# Patient Record
Sex: Female | Born: 1950
Health system: Southern US, Community
[De-identification: ages and names within clinical notes are randomized; demographics above are authoritative.]

## PROBLEM LIST (undated history)

## (undated) DIAGNOSIS — I340 Nonrheumatic mitral (valve) insufficiency: Secondary | ICD-10-CM

## (undated) DIAGNOSIS — M25512 Pain in left shoulder: Secondary | ICD-10-CM

## (undated) DIAGNOSIS — M79641 Pain in right hand: Secondary | ICD-10-CM

## (undated) DIAGNOSIS — M542 Cervicalgia: Secondary | ICD-10-CM

## (undated) DIAGNOSIS — M8580 Other specified disorders of bone density and structure, unspecified site: Secondary | ICD-10-CM

## (undated) HISTORY — DX: Cervicalgia: M54.2

## (undated) HISTORY — DX: Nonrheumatic mitral (valve) insufficiency: I34.0

## (undated) HISTORY — DX: Pain in right hand: M79.641

## (undated) HISTORY — DX: Other specified disorders of bone density and structure, unspecified site: M85.80

## (undated) HISTORY — DX: Pain in left shoulder: M25.512

---

## 1998-04-05 ENCOUNTER — Other Ambulatory Visit: Admission: RE | Admit: 1998-04-05 | Discharge: 1998-04-05 | Payer: Self-pay | Admitting: Gynecology

## 1999-04-30 ENCOUNTER — Other Ambulatory Visit: Admission: RE | Admit: 1999-04-30 | Discharge: 1999-04-30 | Payer: Self-pay | Admitting: Gynecology

## 2004-02-21 ENCOUNTER — Other Ambulatory Visit: Admission: RE | Admit: 2004-02-21 | Discharge: 2004-02-21 | Payer: Self-pay | Admitting: Gynecology

## 2005-04-16 ENCOUNTER — Ambulatory Visit (HOSPITAL_COMMUNITY): Admission: RE | Admit: 2005-04-16 | Discharge: 2005-04-16 | Payer: Self-pay | Admitting: Gastroenterology

## 2013-06-17 ENCOUNTER — Other Ambulatory Visit: Payer: Self-pay | Admitting: Gynecology

## 2013-06-17 DIAGNOSIS — R928 Other abnormal and inconclusive findings on diagnostic imaging of breast: Secondary | ICD-10-CM

## 2013-06-30 ENCOUNTER — Ambulatory Visit
Admission: RE | Admit: 2013-06-30 | Discharge: 2013-06-30 | Disposition: A | Discharge status: Home / Self Care | Payer: BC Managed Care – PPO | Source: Ambulatory Visit | Attending: Gynecology | Admitting: Gynecology

## 2013-06-30 DIAGNOSIS — R928 Other abnormal and inconclusive findings on diagnostic imaging of breast: Secondary | ICD-10-CM

## 2014-06-21 ENCOUNTER — Other Ambulatory Visit: Payer: Self-pay | Admitting: Gynecology

## 2014-06-21 DIAGNOSIS — R928 Other abnormal and inconclusive findings on diagnostic imaging of breast: Secondary | ICD-10-CM

## 2014-06-29 ENCOUNTER — Encounter (INDEPENDENT_AMBULATORY_CARE_PROVIDER_SITE_OTHER): Payer: Self-pay

## 2014-06-29 ENCOUNTER — Ambulatory Visit
Admission: RE | Admit: 2014-06-29 | Discharge: 2014-06-29 | Disposition: A | Discharge status: Home / Self Care | Payer: BC Managed Care – PPO | Source: Ambulatory Visit | Attending: Gynecology | Admitting: Gynecology

## 2014-06-29 DIAGNOSIS — R928 Other abnormal and inconclusive findings on diagnostic imaging of breast: Secondary | ICD-10-CM

## 2015-01-16 ENCOUNTER — Other Ambulatory Visit: Payer: Self-pay | Admitting: Obstetrics

## 2015-01-16 DIAGNOSIS — R921 Mammographic calcification found on diagnostic imaging of breast: Secondary | ICD-10-CM

## 2015-01-27 ENCOUNTER — Ambulatory Visit
Admission: RE | Admit: 2015-01-27 | Discharge: 2015-01-27 | Disposition: A | Discharge status: Home / Self Care | Payer: BLUE CROSS/BLUE SHIELD | Source: Ambulatory Visit | Attending: Obstetrics | Admitting: Obstetrics

## 2015-01-27 DIAGNOSIS — R921 Mammographic calcification found on diagnostic imaging of breast: Secondary | ICD-10-CM

## 2015-05-30 ENCOUNTER — Other Ambulatory Visit: Payer: Self-pay | Admitting: Obstetrics

## 2015-05-30 DIAGNOSIS — R921 Mammographic calcification found on diagnostic imaging of breast: Secondary | ICD-10-CM

## 2015-06-19 ENCOUNTER — Ambulatory Visit
Admission: RE | Admit: 2015-06-19 | Discharge: 2015-06-19 | Disposition: A | Discharge status: Home / Self Care | Payer: BLUE CROSS/BLUE SHIELD | Source: Ambulatory Visit | Attending: Obstetrics | Admitting: Obstetrics

## 2015-06-19 DIAGNOSIS — R921 Mammographic calcification found on diagnostic imaging of breast: Secondary | ICD-10-CM

## 2018-01-30 ENCOUNTER — Telehealth: Payer: Self-pay

## 2018-01-30 NOTE — Telephone Encounter (Signed)
SENT REFERRAL TO SCHEDULING 

## 2018-03-04 DIAGNOSIS — R002 Palpitations: Secondary | ICD-10-CM | POA: Insufficient documentation

## 2018-03-04 DIAGNOSIS — I340 Nonrheumatic mitral (valve) insufficiency: Secondary | ICD-10-CM | POA: Insufficient documentation

## 2018-03-04 NOTE — Progress Notes (Signed)
Cardiology Office Note    Date:  03/05/2018   ID:  Mikayla Mccoy, DOB 07/29/1951, MRN [id number]  PCP:  Mikayla RainierBarnes, Elizabeth, MD  Cardiologist: Mikayla NoeHenry W Smith III, MD   No chief complaint on file.   History of Present Illness:  Mikayla Mccoy is a 67 y.o. female referred by Dr. Juluis RainierElizabeth Mccoy because of palpitations and history of mitral regurgitation.  According to notes there is concerned about the appearance of her EKG.  Mikayla Mccoy is is doing relatively well.  She has not had syncope but has noted tachycardia at times when she awakens from sleep.  She does not feel she is awakening because of the tachycardia.  It can last anywhere from 30 seconds to several minutes.  No associated chest discomfort or dyspnea.  She occasionally feels short of breath when she lies down.  If she lies on her left side the shortness of breath becomes tolerable.  It is not always present.  She has no difficulty with lower extremity edema, chest pain, lightheadedness, dizziness, syncope, or exertional dyspnea.  Past Medical History:  Diagnosis Date  . Mitral regurgitation    mildly thickened MV by echo 7/11  . Neck pain   . Osteopenia   . Right hand pain   . Shoulder pain, left     No past surgical history on file.  Current Medications: Outpatient Medications Prior to Visit  Medication Sig Dispense Refill  . FIBER PO Take 1 tablet by mouth daily.    Marland Kitchen. ibuprofen (ADVIL,MOTRIN) 200 MG tablet Take 400 mg by mouth 3 (three) times daily as needed for headache or moderate pain.    . Multiple Vitamins-Minerals (MULTIVITAMIN PO) Take 1 tablet by mouth daily.    . Calcium Carbonate-Vitamin D (CALCIUM 500 + D PO) Chew 1 tablet by mouth daily.    . cholecalciferol (VITAMIN D) 1000 UNITS tablet Take 1,000 Units by mouth daily.    Marland Kitchen. estrogens, conjugated, (PREMARIN) 1.25 MG tablet Take 1.25 mg by mouth daily.    . fluticasone (FLONASE) 50 MCG/ACT nasal spray Place 1 spray into both nostrils daily.     Marland Kitchen.  KRILL OIL PO Take 1,000 mg by mouth daily.      No facility-administered medications prior to visit.      Allergies:   Codeine   Social History   Socioeconomic History  . Marital status: Married    Spouse name: None  . Number of children: None  . Years of education: None  . Highest education level: None  Social Needs  . Financial resource strain: None  . Food insecurity - worry: None  . Food insecurity - inability: None  . Transportation needs - medical: None  . Transportation needs - non-medical: None  Occupational History  . None  Tobacco Use  . Smoking status: Never Smoker  . Smokeless tobacco: Never Used  Substance and Sexual Activity  . Alcohol use: Yes    Comment: 1-2 per week  . Drug use: No  . Sexual activity: None  Other Topics Concern  . None  Social History Narrative   Tobacco use cigarettes: Never smoked   Tobacco history last updated 04/14/2014   No smoking   Alcohol: occasionally   Caffeine: yes    Occupation:part-time Sports coachcommunity service coordinator   Martial Status : married   Children 2 sons,1 daughter                 Family History:  The patient's family history includes Congenital  heart disease in her brother; Diabetes in her sister; Healthy in her brother, brother, daughter, son, and son; Hypertension in her brother, father, sister, and sister; Hypertrophic cardiomyopathy in her brother; Kidney disease in her sister; Lung disease in her father; Melanoma in her sister; Other in her mother; Parkinson's disease in her sister; Squamous cell carcinoma in her sister.  Mother had atrial fibrillation and a pacemaker.  Father died of a myocardial infarction.  Also has COPD.Marland Kitchen  Another brother had difficulty with an accessory pathway and had ablation because of arrhythmia.  Another brother has hypertrophic obstructive cardiomyopathy  ROS:   Please see the history of present illness.    Some anxiety concerning her strong family history of cardiac issues.   Also has had some concern because of elevated blood pressure recordings. All other systems reviewed and are negative.   PHYSICAL EXAM:   VS:  BP (!) 144/86   Pulse 68   Ht 5\' 6"  (1.676 m)   Wt 140 lb 12.8 oz (63.9 kg)   BMI 22.73 kg/m    GEN: Well nourished, well developed, in no acute distress  HEENT: normal  Neck: no JVD, carotid bruits, or masses Cardiac: RRR; no murmur of mitral regurgitation is noted. No rubs, or gallops,no edema  Respiratory:  clear to auscultation bilaterally, normal work of breathing GI: soft, nontender, nondistended, + BS MS: no deformity or atrophy  Skin: warm and dry, no rash Neuro:  Alert and Oriented x 3, Strength and sensation are intact Psych: euthymic mood, full affect  Wt Readings from Last 3 Encounters:  03/05/18 140 lb 12.8 oz (63.9 kg)      Studies/Labs Reviewed:   EKG:  EKG relatively low voltage, nonspecific T wave flattening, differing P wave morphology with atrial premature complexes noted.  Recent Labs: No results found for requested labs within last 8760 hours.   Lipid Panel No results found for: CHOL, TRIG, HDL, CHOLHDL, VLDL, LDLCALC, LDLDIRECT  Additional studies/ records that were reviewed today include:  Echocardiogram performed in 2011 by report from [facility] demonstrated mild mitral regurgitation, no mitral valve prolapse, and there was no mention of cardiomyopathy.    ASSESSMENT:    1. Palpitations   2. Non-rheumatic mitral regurgitation   3. SOB (shortness of breath)   4. Family history of vascular disease      PLAN:  In order of problems listed above:  1. EKGs of demonstrated PACs.  Nocturnal tachycardia of uncertain etiology.  Given family history of arrhythmia including accessory pathway in atrial fibrillation, we will do a screening 48-hour Holter monitor to exclude asymptomatic arrhythmias that could be associated with embolic CVA. 2. I do not believe she has significant mitral valve disease and do not feel a  repeat echo is indicated unless we find significant rhythm disturbance on screening monitor. 3. Shortness of breath could be GI related to reflux.  Rule out an atypical manifestation of coronary disease given her family history.  We will perform an exercise treadmill test to judge blood pressure response, rule out ischemia, and to assess exertional tolerance.  This will help [place] to exclude CAD.   If she does well on the above tests, no further evaluation will be required.    Medication Adjustments/Labs and Tests Ordered: Current medicines are reviewed at length with the patient today.  Concerns regarding medicines are outlined above.  Medication changes, Labs and Tests ordered today are listed in the Patient Instructions below. Patient Instructions  Medication Instructions:  Your physician  recommends that you continue on your current medications as directed. Please refer to the Current Medication list given to you today.  Labwork: None  Testing/Procedures: Your physician has requested that you have an exercise tolerance test. For further information please visit [email or web address] Please also follow instruction sheet, as given.  Your physician has recommended that you wear a 48 hour holter monitor. Holter monitors are medical devices that record the heart's electrical activity. Doctors most often use these monitors to diagnose arrhythmias. Arrhythmias are problems with the speed or rhythm of the heartbeat. The monitor is a small, portable device. You can wear one while you do your normal daily activities. This is usually used to diagnose what is causing palpitations/syncope (passing out).   Follow-Up: Your physician recommends that you schedule a follow-up appointment as needed with Dr. Katrinka Blazing.    Any Other Special Instructions Will Be Listed Below (If Applicable).     If you need a refill on your cardiac medications before your next appointment, please call your pharmacy.       Signed, Mikayla Noe, MD  03/05/2018 11:22 AM    [facility] [address] [place], [place], [place]  [address] Phone: [phone]; Fax: [phone]

## 2018-03-05 ENCOUNTER — Encounter: Payer: Self-pay | Admitting: Interventional Cardiology

## 2018-03-05 ENCOUNTER — Ambulatory Visit (INDEPENDENT_AMBULATORY_CARE_PROVIDER_SITE_OTHER): Payer: BLUE CROSS/BLUE SHIELD | Admitting: Interventional Cardiology

## 2018-03-05 VITALS — BP 144/86 | HR 68 | Ht 66.0 in | Wt 140.8 lb

## 2018-03-05 DIAGNOSIS — I340 Nonrheumatic mitral (valve) insufficiency: Secondary | ICD-10-CM

## 2018-03-05 DIAGNOSIS — Z8249 Family history of ischemic heart disease and other diseases of the circulatory system: Secondary | ICD-10-CM

## 2018-03-05 DIAGNOSIS — R002 Palpitations: Secondary | ICD-10-CM

## 2018-03-05 DIAGNOSIS — R0602 Shortness of breath: Secondary | ICD-10-CM

## 2018-03-05 NOTE — Patient Instructions (Signed)
Medication Instructions:  Your physician recommends that you continue on your current medications as directed. Please refer to the Current Medication list given to you today.  Labwork: None  Testing/Procedures: Your physician has requested that you have an exercise tolerance test. For further information please visit [email or web address] Please also follow instruction sheet, as given.  Your physician has recommended that you wear a 48 hour holter monitor. Holter monitors are medical devices that record the heart's electrical activity. Doctors most often use these monitors to diagnose arrhythmias. Arrhythmias are problems with the speed or rhythm of the heartbeat. The monitor is a small, portable device. You can wear one while you do your normal daily activities. This is usually used to diagnose what is causing palpitations/syncope (passing out).   Follow-Up: Your physician recommends that you schedule a follow-up appointment as needed with Dr. Katrinka BlazingSmith.    Any Other Special Instructions Will Be Listed Below (If Applicable).     If you need a refill on your cardiac medications before your next appointment, please call your pharmacy.

## 2018-03-18 ENCOUNTER — Ambulatory Visit (INDEPENDENT_AMBULATORY_CARE_PROVIDER_SITE_OTHER): Payer: BLUE CROSS/BLUE SHIELD

## 2018-03-18 DIAGNOSIS — R0602 Shortness of breath: Secondary | ICD-10-CM

## 2018-03-18 DIAGNOSIS — R002 Palpitations: Secondary | ICD-10-CM

## 2018-03-18 LAB — EXERCISE TOLERANCE TEST
Estimated workload: 10.1 METS
Exercise duration (min): 8 min
Exercise duration (sec): 0 s
MPHR: 155 {beats}/min
Peak HR: 153 {beats}/min
Percent HR: 98 %
RPE: 16
Rest HR: 98 {beats}/min

## 2018-04-09 ENCOUNTER — Telehealth: Payer: Self-pay | Admitting: Interventional Cardiology

## 2018-04-09 NOTE — Telephone Encounter (Signed)
Spoke with Mikayla Mccoy and made her aware that results have been sent to Dr. Katrinka BlazingSmith and once he reviews those I will give her a call with the results.  Mikayla Mccoy appreciative for call.

## 2018-04-09 NOTE — Telephone Encounter (Signed)
New Message  Pt calling to check on her holter monitor results. Please call

## 2018-04-13 ENCOUNTER — Telehealth: Payer: Self-pay | Admitting: *Deleted

## 2018-04-13 MED ORDER — METOPROLOL SUCCINATE ER 25 MG PO TB24: 25.0000 mg | ORAL_TABLET | Freq: Every day | ORAL | 3 refills | Status: DC

## 2018-04-13 NOTE — Telephone Encounter (Signed)
Spoke with pt and went over results and recommendations per Dr. Katrinka BlazingSmith. Pt verbalized understanding and was in agreement with this plan.  Scheduled pt to see Dr. Katrinka BlazingSmith on 5/28.  Pt appreciative for call.

## 2018-04-13 NOTE — Telephone Encounter (Signed)
-----   Message from Lyn RecordsHenry W Smith, MD sent at 04/11/2018  9:09 AM EDT ----- Let the patient know she is having SVT up to 16 beat runs and this is the likely explanation for fast heart rate at night. Recommend metoprolol succinate 25 mg daily and f/u to discuss in 4-6 weeks. Call if medication side effects, A copy will be sent to Juluis RainierBarnes, Elizabeth, MD

## 2018-05-25 NOTE — Progress Notes (Signed)
Cardiology Office Note    Date:  05/26/2018   ID:  Mikayla Mccoy, DOB 04/04/1951, MRN [id number]  PCP:  Juluis Rainier, MD  Cardiologist: Lesleigh Noe, MD   Chief Complaint  Patient presents with  . Coronary Artery Disease  . Irregular Heart Beat    History of Present Illness:  Mikayla Mccoy is a 67 y.o. female referred by Dr. Juluis Rainier because of palpitations and history of mitral regurgitation.  According to notes there is concerned about the appearance of her EKG.  Since the most recent office visit, she has had one episode of chest pressure with radiation of discomfort to the right jaw.  The episode occurred relatively spontaneously.  She has had no exertional chest discomfort.  There is no peripheral edema.  Medication side effects.  Recent monitor demonstrated brief PSVT.  Metoprolol was started.  No significant change in palpitations has been noted.   Past Medical History:  Diagnosis Date  . Mitral regurgitation    mildly thickened MV by echo 7/11  . Neck pain   . Osteopenia   . Right hand pain   . Shoulder pain, left     History reviewed. No pertinent surgical history.  Current Medications: Outpatient Medications Prior to Visit  Medication Sig Dispense Refill  . FIBER PO Take 1 tablet by mouth daily.    Marland Kitchen ibuprofen (ADVIL,MOTRIN) 200 MG tablet Take 400 mg by mouth 3 (three) times daily as needed for headache or moderate pain.    . metoprolol succinate (TOPROL-XL) 25 MG 24 hr tablet Take 1 tablet (25 mg total) by mouth daily. 90 tablet 3  . Multiple Vitamins-Minerals (MULTIVITAMIN PO) Take 1 tablet by mouth daily.     No facility-administered medications prior to visit.      Allergies:   Codeine   Social History   Socioeconomic History  . Marital status: Married    Spouse name: Not on file  . Number of children: Not on file  . Years of education: Not on file  . Highest education level: Not on file  Occupational History  . Not on file    Social Needs  . Financial resource strain: Not on file  . Food insecurity:    Worry: Not on file    Inability: Not on file  . Transportation needs:    Medical: Not on file    Non-medical: Not on file  Tobacco Use  . Smoking status: Never Smoker  . Smokeless tobacco: Never Used  Substance and Sexual Activity  . Alcohol use: Yes    Comment: 1-2 per week  . Drug use: No  . Sexual activity: Not on file  Lifestyle  . Physical activity:    Days per week: Not on file    Minutes per session: Not on file  . Stress: Not on file  Relationships  . Social connections:    Talks on phone: Not on file    Gets together: Not on file    Attends religious service: Not on file    Active member of club or organization: Not on file    Attends meetings of clubs or organizations: Not on file    Relationship status: Not on file  Other Topics Concern  . Not on file  Social History Narrative   Tobacco use cigarettes: Never smoked   Tobacco history last updated 04/14/2014   No smoking   Alcohol: occasionally   Caffeine: yes    Occupation:part-time Sports coach  Martial Status : married   Children 2 sons,1 daughter                 Family History:  The patient's family history includes Congenital heart disease in her brother; Diabetes in her sister; Healthy in her brother, brother, daughter, son, and son; Hypertension in her brother, father, sister, and sister; Hypertrophic cardiomyopathy in her brother; Kidney disease in her sister; Lung disease in her father; Melanoma in her sister; Other in her mother; Parkinson's disease in her sister; Squamous cell carcinoma in her sister.   ROS:   Please see the history of present illness.    She is not allergic to any medications.  She feels okay. All other systems reviewed and are negative.   PHYSICAL EXAM:   VS:  BP 104/60   Pulse 65   Ht  (1.702 m)   Wt 137 lb 12.8 oz (62.5 kg)   BMI 21.58 kg/m    GEN: Well nourished,  well developed, in no acute distress  HEENT: normal  Neck: no JVD, carotid bruits, or masses Cardiac: RRR; no murmurs, rubs, or gallops,no edema  Respiratory:  clear to auscultation bilaterally, normal work of breathing GI: soft, nontender, nondistended, + BS MS: no deformity or atrophy  Skin: warm and dry, no rash Neuro:  Alert and Oriented x 3, Strength and sensation are intact Psych: euthymic mood, full affect  Wt Readings from Last 3 Encounters:  05/26/18 137 lb 12.8 oz (62.5 kg)  03/05/18 140 lb 12.8 oz (63.9 kg)      Studies/Labs Reviewed:   EKG:  EKG  Not done  Recent Labs: No results found for requested labs within last 8760 hours.   Lipid Panel No results found for: CHOL, TRIG, HDL, CHOLHDL, VLDL, LDLCALC, LDLDIRECT  Additional studies/ records that were reviewed today include:   48 Hour Holter Monitor 03/18/18: Study Highlights      Basic underlying rhythm is normal sinus  PVCs and PACs are noted.  No sustained arrhythmias are noted.  Nonsustained SVT up to 157 bpm, longest episode 16 beats.  No symptoms recorded.   Basic underlying rhythm is normal sinus rhythm. Nonsustained SVT up to 16 beats. Isolated premature ventricular contractions.   Minimum HR: 48 BPM at 3:17:36 AM(2) Maximum HR: 152 BPM at 5:14:55 PM Average HR: 86 BPM    STRESS TEST 03/18/2018 Study Highlights      Blood pressure demonstrated a normal response to exercise.  There was no ST segment deviation noted during stress. Occasional PVC's  8 minutes exercise time. No CP.  Low risk exercise treadmill test with no electrocardiographic evidence of ischemia.       ASSESSMENT:    1. Angina pectoris (HCC)   2. Non-rheumatic mitral regurgitation   3. Palpitations      PLAN:  In order of problems listed above:  1. The chest pain episode is consistent with angina.  Given strong family history of CAD (father), hyperlipidemia, and age I feel further evaluation for ischemic  heart disease risk is necessary.  Plan to perform coronary CT with FFR if indicated.  Calcium score will help [place] also to determine if aggressive statin therapy is needed. 2. Clinically not significant 3. No change since starting beta-blocker therapy.  Aspirin once daily at 81 mg.  Will get lipid values from Dr. Zachery Dauer.  Depending upon findings from CT will decide about statin therapy.  CT coronary angios with morphology and FFR if indicated.  49-month follow-up  Extended office visit with greater than 50% of the 40-minute office visit spent rehashing risk factors for CAD, establishing treatment goals for risk modification, and coordinating further evaluation to exclude obstructive CAD.  Medication Adjustments/Labs and Tests Ordered: Current medicines are reviewed at length with the patient today.  Concerns regarding medicines are outlined above.  Medication changes, Labs and Tests ordered today are listed in the Patient Instructions below. There are no Patient Instructions on file for this visit.   Signed, Lesleigh Noe, MD  05/26/2018 10:40 AM    [facility] [address] [place], [place], [place]  [address] Phone: [phone]; Fax: [phone]

## 2018-05-26 ENCOUNTER — Encounter: Payer: Self-pay | Admitting: Interventional Cardiology

## 2018-05-26 ENCOUNTER — Ambulatory Visit (INDEPENDENT_AMBULATORY_CARE_PROVIDER_SITE_OTHER): Payer: BLUE CROSS/BLUE SHIELD | Admitting: Interventional Cardiology

## 2018-05-26 VITALS — BP 104/60 | HR 65 | Ht 67.0 in | Wt 137.8 lb

## 2018-05-26 DIAGNOSIS — I340 Nonrheumatic mitral (valve) insufficiency: Secondary | ICD-10-CM | POA: Diagnosis not present

## 2018-05-26 DIAGNOSIS — I209 Angina pectoris, unspecified: Secondary | ICD-10-CM | POA: Diagnosis not present

## 2018-05-26 DIAGNOSIS — R002 Palpitations: Secondary | ICD-10-CM | POA: Diagnosis not present

## 2018-05-26 MED ORDER — ASPIRIN EC 81 MG PO TBEC: 81.0000 mg | DELAYED_RELEASE_TABLET | Freq: Every day | ORAL | 3 refills | Status: AC

## 2018-05-26 NOTE — Patient Instructions (Addendum)
Medication Instructions:  1) START Aspirin  once daily  Labwork: Your physician recommends that you return for lab work when you are fasting (Lipid, CRP)   Testing/Procedures: Your physician has recommended that you have a Coronary CT with possible FFR.  Follow-Up: Your physician recommends that you schedule a follow-up appointment in:  2-3 months with Dr. Katrinka Blazing (Can have 7/31 at 11:40A)   Any Other Special Instructions Will Be Listed Below (If Applicable).  Please arrive at the [facility] main entrance of [facility] (30-45 minutes prior to test start time)  [facility] [address] [place], [place] [address] [phone]  Proceed to the [facility] Radiology Department (First Floor).  Please follow these instructions carefully (unless otherwise directed):  Hold all erectile dysfunction medications at least 48 hours prior to test.  On the Night Before the Test: . Drink plenty of water. . Do not consume any caffeinated/decaffeinated beverages or chocolate 12 hours prior to your test. . Do not take any antihistamines 12 hours prior to your test. . If you take Metformin do not take 24 hours prior to test.  On the Day of the Test: . Drink plenty of water. Do not drink any water within one hour of the test. . Do not eat any food 4 hours prior to the test. . You may take your regular medications prior to the test.  After the Test: . Drink plenty of water. . After receiving IV contrast, you may experience a mild flushed feeling. This is normal. . On occasion, you may experience a mild rash up to 24 hours after the test. This is not dangerous. If this occurs, you can take Benadryl 25 mg and increase your fluid intake. . If you experience trouble breathing, this can be serious. If it is severe call 911 IMMEDIATELY. If it is mild, please call our office. . If you take any of these medications: Glipizide/Metformin, Avandament, Glucavance, please do not  take 48 hours after completing test.    If you need a refill on your cardiac medications before your next appointment, please call your pharmacy.

## 2018-05-28 ENCOUNTER — Other Ambulatory Visit: Payer: BLUE CROSS/BLUE SHIELD | Admitting: *Deleted

## 2018-05-28 DIAGNOSIS — I209 Angina pectoris, unspecified: Secondary | ICD-10-CM

## 2018-05-28 LAB — LIPID PANEL
Chol/HDL Ratio: 3.3 ratio (ref 0.0–4.4)
Cholesterol, Total: 242 mg/dL — ABNORMAL HIGH (ref 100–199)
HDL: 74 mg/dL (ref >39)
LDL Calculated: 144 mg/dL — ABNORMAL HIGH (ref 0–99)
Triglycerides: 118 mg/dL (ref 0–149)
VLDL Cholesterol Cal: 24 mg/dL (ref 5–40)

## 2018-05-28 LAB — HIGH SENSITIVITY CRP: CRP, High Sensitivity: 0.96 mg/L (ref 0.00–3.00)

## 2018-07-06 ENCOUNTER — Other Ambulatory Visit: Payer: Self-pay | Admitting: *Deleted

## 2018-07-06 DIAGNOSIS — R002 Palpitations: Secondary | ICD-10-CM

## 2018-07-16 ENCOUNTER — Other Ambulatory Visit: Payer: BLUE CROSS/BLUE SHIELD | Admitting: *Deleted

## 2018-07-16 DIAGNOSIS — R002 Palpitations: Secondary | ICD-10-CM

## 2018-07-16 LAB — BASIC METABOLIC PANEL
BUN/Creatinine Ratio: 28 (ref 12–28)
BUN: 21 mg/dL (ref 8–27)
CO2: 22 mmol/L (ref 20–29)
Calcium: 9.6 mg/dL (ref 8.7–10.3)
Chloride: 102 mmol/L (ref 96–106)
Creatinine, Ser: 0.74 mg/dL (ref 0.57–1.00)
GFR calc Af Amer: 98 mL/min/{1.73_m2} (ref >59)
GFR calc non Af Amer: 85 mL/min/{1.73_m2} (ref >59)
Glucose: 92 mg/dL (ref 65–99)
Potassium: 4.4 mmol/L (ref 3.5–5.2)
Sodium: 140 mmol/L (ref 134–144)

## 2018-07-23 ENCOUNTER — Ambulatory Visit (HOSPITAL_COMMUNITY): Payer: BLUE CROSS/BLUE SHIELD

## 2018-07-23 ENCOUNTER — Ambulatory Visit (HOSPITAL_COMMUNITY)
Admission: RE | Admit: 2018-07-23 | Discharge: 2018-07-23 | Disposition: A | Discharge status: Home / Self Care | Payer: BLUE CROSS/BLUE SHIELD | Source: Ambulatory Visit | Attending: Interventional Cardiology | Admitting: Interventional Cardiology

## 2018-07-23 DIAGNOSIS — I209 Angina pectoris, unspecified: Secondary | ICD-10-CM

## 2018-07-23 MED ORDER — IOPAMIDOL (ISOVUE-370) INJECTION 76%
INTRAVENOUS | Status: AC
  Filled 2018-07-23: qty 100

## 2018-07-29 ENCOUNTER — Ambulatory Visit (INDEPENDENT_AMBULATORY_CARE_PROVIDER_SITE_OTHER): Payer: BLUE CROSS/BLUE SHIELD | Admitting: Interventional Cardiology

## 2018-07-29 ENCOUNTER — Encounter: Payer: Self-pay | Admitting: Interventional Cardiology

## 2018-07-29 ENCOUNTER — Ambulatory Visit (INDEPENDENT_AMBULATORY_CARE_PROVIDER_SITE_OTHER)
Admission: RE | Admit: 2018-07-29 | Discharge: 2018-07-29 | Disposition: A | Discharge status: Home / Self Care | Payer: BLUE CROSS/BLUE SHIELD | Source: Ambulatory Visit | Attending: Interventional Cardiology | Admitting: Interventional Cardiology

## 2018-07-29 VITALS — BP 102/68 | HR 76 | Ht 67.0 in | Wt 138.0 lb

## 2018-07-29 DIAGNOSIS — R0789 Other chest pain: Secondary | ICD-10-CM | POA: Diagnosis not present

## 2018-07-29 DIAGNOSIS — I471 Supraventricular tachycardia: Secondary | ICD-10-CM | POA: Diagnosis not present

## 2018-07-29 DIAGNOSIS — I340 Nonrheumatic mitral (valve) insufficiency: Secondary | ICD-10-CM

## 2018-07-29 NOTE — Patient Instructions (Signed)
Medication Instructions:  Your physician recommends that you continue on your current medications as directed. Please refer to the Current Medication list given to you today.  Labwork: None  Testing/Procedures: Your physician recommends that you have a Calcium Score.  Follow-Up: Your physician recommends that you schedule a follow-up appointment as needed with Dr. Katrinka BlazingSmith.    Any Other Special Instructions Will Be Listed Below (If Applicable).     If you need a refill on your cardiac medications before your next appointment, please call your pharmacy.

## 2018-07-29 NOTE — Progress Notes (Signed)
Cardiology Office Note:    Date:  07/29/2018   ID:  Mikayla Mccoy, DOB 11/29/1951, MRN [id number]  PCP:  Juluis RainierBarnes, Elizabeth, MD  Cardiologist:  Lesleigh NoeHenry W Smith III, MD   Referring MD: Juluis RainierBarnes, Elizabeth, MD   Chief Complaint  Patient presents with  . Irregular Heart Beat  . Chest Pain    History of Present Illness:    Mikayla Boysrlene Jilek is a 67 y.o. female with a hx of documented exercise-induced chest and jaw discomfort.  Exercise treadmill test did not reveal ST-T wave change consistent with angina..  Still having occasional palpitations.  Palpitations do not last longer than several seconds.  A recent monitor for 48 hours demonstrated nonsustained SVT lasting up to 16 beats at rates of 155 bpm.  No atrial fibrillation noted.  She still has occasional jaw and chest discomfort that occur at random.  We attempted to perform a coronary CT Angie oh but cardiac irregularity prevented gating and the study was therefore not performed.  She is resistant to the idea of medication therapy for hyperlipidemia.  A more detailed history concerning her family is that of one brother with cardiomyopathy, 2 siblings with heart arrhythmia, and mother with heart arrhythmia.   Past Medical History:  Diagnosis Date  . Mitral regurgitation    mildly thickened MV by echo 7/11  . Neck pain   . Osteopenia   . Right hand pain   . Shoulder pain, left     No past surgical history on file.  Current Medications: Current Meds  Medication Sig  . aspirin EC 81 MG tablet Take 1 tablet (81 mg total) by mouth daily.  . Cyanocobalamin (VITAMIN B-12 CR PO) Take by mouth daily.  Marland Kitchen. FIBER PO Take 1 tablet by mouth daily.  Marland Kitchen. ibuprofen (ADVIL,MOTRIN) 200 MG tablet Take 400 mg by mouth 3 (three) times daily as needed for headache or moderate pain.  . metoprolol succinate (TOPROL-XL) 25 MG 24 hr tablet Take 1 tablet (25 mg total) by mouth daily.  . Multiple Vitamins-Minerals (MULTIVITAMIN PO) Take 1 tablet by mouth  daily.  . Vitamin D, Ergocalciferol, 2000 units CAPS Take 2,000 Units by mouth daily.     Allergies:   Codeine   Social History   Socioeconomic History  . Marital status: Married    Spouse name: Not on file  . Number of children: Not on file  . Years of education: Not on file  . Highest education level: Not on file  Occupational History  . Not on file  Social Needs  . Financial resource strain: Not on file  . Food insecurity:    Worry: Not on file    Inability: Not on file  . Transportation needs:    Medical: Not on file    Non-medical: Not on file  Tobacco Use  . Smoking status: Never Smoker  . Smokeless tobacco: Never Used  Substance and Sexual Activity  . Alcohol use: Yes    Comment: 1-2 per week  . Drug use: No  . Sexual activity: Not on file  Lifestyle  . Physical activity:    Days per week: Not on file    Minutes per session: Not on file  . Stress: Not on file  Relationships  . Social connections:    Talks on phone: Not on file    Gets together: Not on file    Attends religious service: Not on file    Active member of club or organization: Not on file  Attends meetings of clubs or organizations: Not on file    Relationship status: Not on file  Other Topics Concern  . Not on file  Social History Narrative   Tobacco use cigarettes: Never smoked   Tobacco history last updated 04/14/2014   No smoking   Alcohol: occasionally   Caffeine: yes    Occupation:part-time Research officer, political party Status : married   Children 2 sons,1 daughter                 Family History: The patient's family history includes Congenital heart disease in her brother; Diabetes in her sister; Healthy in her brother, brother, daughter, son, and son; Hypertension in her brother, father, sister, and sister; Hypertrophic cardiomyopathy in her brother; Kidney disease in her sister; Lung disease in her father; Melanoma in her sister; Other in her mother; Parkinson's  disease in her sister; Squamous cell carcinoma in her sister.  ROS:   Please see the history of present illness.    Snoring, anxiety, irregular heartbeats, skipped heartbeats, leg pain, excessive fatigue, depression.  All other systems reviewed and are negative.  EKGs/Labs/Other Studies Reviewed:    The following studies were reviewed today: 48-hour Holter monitor 02/2018: Study Highlights     Basic underlying rhythm is normal sinus  PVCs and PACs are noted.  No sustained arrhythmias are noted.  Nonsustained SVT up to 157 bpm, longest episode 16 beats.  No symptoms recorded.   Basic underlying rhythm is normal sinus rhythm. Nonsustained SVT up to 16 beats. Isolated premature ventricular contractions.  Minimum HR: 48 BPM at 3:17:36 AM(2) Maximum HR: 152 BPM at 5:14:55 PM Average HR: 86 BPM   Exercise treadmill test 03/18/2018: Study Highlights     Blood pressure demonstrated a normal response to exercise.  There was no ST segment deviation noted during stress. Occasional PVC's  8 minutes exercise time. No CP.  Low risk exercise treadmill test with no electrocardiographic evidence of ischemia.     EKG:  EKG is not ordered today.    Recent Labs: 07/16/2018: BUN 21; Creatinine, Ser 0.74; Potassium 4.4; Sodium 140  Recent Lipid Panel    Component Value Date/Time   CHOL 242 (H) 05/28/2018 0923   TRIG 118 05/28/2018 0923   HDL 74 05/28/2018 0923   CHOLHDL 3.3 05/28/2018 0923   LDLCALC 144 (H) 05/28/2018 0923    Physical Exam:    VS:  BP 102/68   Pulse 76   Ht 5\' 7"  (1.702 m)   Wt 138 lb (62.6 kg)   BMI 21.61 kg/m     Wt Readings from Last 3 Encounters:  07/29/18 138 lb (62.6 kg)  05/26/18 137 lb 12.8 oz (62.5 kg)  03/05/18 140 lb 12.8 oz (63.9 kg)     GEN:  Well nourished, well developed in no acute distress HEENT: Normal NECK: No JVD. LYMPHATICS: No lymphadenopathy CARDIAC: RRR, 1/6 systolic murmur, no gallop, no edema. VASCULAR: Radial and  dorsalis pedis 2+ pulses.  No bruits. RESPIRATORY:  Clear to auscultation without rales, wheezing or rhonchi  ABDOMEN: Soft, non-tender, non-distended, No pulsatile mass, MUSCULOSKELETAL: No deformity  SKIN: Warm and dry NEUROLOGIC:  Alert and oriented x 3 PSYCHIATRIC:  Normal affect   ASSESSMENT:    1. Chest tightness   2. PSVT (paroxysmal supraventricular tachycardia) (HCC)   3. Non-rheumatic mitral regurgitation    PLAN:    In order of problems listed above:  1. We will perform coronary calcium score.  May need aggressive  lipid-lowering if plaque identified.  Continue aspirin daily. 2. Metoprolol XL 25 mg/day for nonsustained PSVT.  If she continues to have symptoms, may need a 30-day monitor. 3. Clinically insignificant at this time.  No significant murmur noted.  Clinical follow-up as needed.  Statin therapy if significant elevation and coronary calcium score.   Medication Adjustments/Labs and Tests Ordered: Current medicines are reviewed at length with the patient today.  Concerns regarding medicines are outlined above.  Orders Placed This Encounter  Procedures  . CT CARDIAC SCORING   No orders of the defined types were placed in this encounter.   Patient Instructions  Medication Instructions:  Your physician recommends that you continue on your current medications as directed. Please refer to the Current Medication list given to you today.  Labwork: None  Testing/Procedures: Your physician recommends that you have a Calcium Score.  Follow-Up: Your physician recommends that you schedule a follow-up appointment as needed with Dr. Katrinka Blazing.    Any Other Special Instructions Will Be Listed Below (If Applicable).     If you need a refill on your cardiac medications before your next appointment, please call your pharmacy.      Signed, Lesleigh Noe, MD  07/29/2018 12:32 PM    [facility]

## 2018-08-05 ENCOUNTER — Telehealth: Payer: Self-pay

## 2018-08-05 ENCOUNTER — Telehealth: Payer: Self-pay | Admitting: Interventional Cardiology

## 2018-08-05 DIAGNOSIS — E785 Hyperlipidemia, unspecified: Secondary | ICD-10-CM

## 2018-08-05 MED ORDER — ROSUVASTATIN CALCIUM 10 MG PO TABS: 10.0000 mg | ORAL_TABLET | Freq: Every day | ORAL | 11 refills | Status: DC

## 2018-08-05 NOTE — Telephone Encounter (Signed)
New Message:  Patient calling concerning results.

## 2018-08-05 NOTE — Telephone Encounter (Signed)
Returned call to Pt.   Advised of cardiac CT results.  Advised per Dr. Katrinka BlazingSmith start rosuvastatin 10 mg daily.  Recheck lipids/liver in 6 weeks.

## 2018-09-07 ENCOUNTER — Other Ambulatory Visit: Payer: BLUE CROSS/BLUE SHIELD | Admitting: *Deleted

## 2018-09-07 ENCOUNTER — Telehealth: Payer: Self-pay | Admitting: Interventional Cardiology

## 2018-09-07 DIAGNOSIS — E785 Hyperlipidemia, unspecified: Secondary | ICD-10-CM

## 2018-09-07 LAB — LIPID PANEL
Chol/HDL Ratio: 2.3 ratio (ref 0.0–4.4)
Cholesterol, Total: 181 mg/dL (ref 100–199)
HDL: 79 mg/dL (ref >39)
LDL Calculated: 85 mg/dL (ref 0–99)
Triglycerides: 84 mg/dL (ref 0–149)
VLDL Cholesterol Cal: 17 mg/dL (ref 5–40)

## 2018-09-07 LAB — HEPATIC FUNCTION PANEL
ALT: 22 IU/L (ref 0–32)
AST: 25 IU/L (ref 0–40)
Albumin: 4.6 g/dL (ref 3.6–4.8)
Alkaline Phosphatase: 89 IU/L (ref 39–117)
Bilirubin Total: 0.8 mg/dL (ref 0.0–1.2)
Bilirubin, Direct: 0.23 mg/dL (ref 0.00–0.40)
Total Protein: 6.5 g/dL (ref 6.0–8.5)

## 2018-09-07 NOTE — Telephone Encounter (Signed)
Will route to PharmD to see if any conflict with cardiac meds?

## 2018-09-07 NOTE — Telephone Encounter (Signed)
There are no issues with Imvexxy and her cardiac medications. This is an estradiol vaginal insert so may have similar side effect profile as systemic estradiol including VTE, HTN, edema, and headache. No noted family history of VTE. Would advise pt to monitor BP at home and for any swelling out of the ordinary.

## 2018-09-07 NOTE — Telephone Encounter (Signed)
New message    Pt came in for labs this morning and wanted to let Dr. Katrinka Blazing know she started taking Imvexxy.

## 2018-09-08 NOTE — Telephone Encounter (Signed)
Spoke with pt and made her aware of information provided by PharmD.  Pt verbalized understanding and was appreciative for call.

## 2019-04-07 ENCOUNTER — Other Ambulatory Visit: Payer: Self-pay | Admitting: Interventional Cardiology

## 2019-07-03 ENCOUNTER — Other Ambulatory Visit: Payer: Self-pay | Admitting: Interventional Cardiology

## 2019-07-05 NOTE — Telephone Encounter (Signed)
Ok to fill 

## 2019-10-15 ENCOUNTER — Other Ambulatory Visit: Payer: Self-pay | Admitting: Interventional Cardiology

## 2019-10-15 MED ORDER — ROSUVASTATIN CALCIUM 10 MG PO TABS: 10.0000 mg | ORAL_TABLET | Freq: Every day | ORAL | 0 refills | Status: DC

## 2019-10-15 NOTE — Telephone Encounter (Signed)
Pt's medication was sent to pt's pharmacy as requested. Confirmation received.  °

## 2019-11-05 NOTE — Progress Notes (Signed)
Cardiology Office Note   Date:  11/11/2019   ID:  Yona Kosek, DOB September 29, 1951, MRN [id number]  PCP:  Leighton Ruff, MD  Cardiologist: Dr. Tamala Julian, MD  Chief Complaint  Patient presents with   Follow-up    History of Present Illness: Mikayla Mccoy is a 68 y.o. female who presents for one year follow-up, seen for Dr. Tamala Julian.  Mikayla Mccoy has a history of exercise-induced chest pain in which she underwent an exercise treadmill stress test 03/18/2018 that was normal.  Also with a history of occasional palpitations in which she wore a 48-hour monitor that demonstrated nonsustained SVT lasting up to 16 beats with rates up to 155 bpm. There was no atrial fibrillation noted.  Seen by Dr. Tamala Julian on 07/29/2018 and continued to have occasional jaw and chest discomfort that would occur intermittently, nonexertional and exertional qualities.  Attempts to perform a coronary CTA however due to cardiac irregularity the study was not performed therefore coronary calcium scoring was performed 07/29/2018 which showed a coronary calcium score of 23, 66th percentile for age and sex matched control.  She was continued on Toprol XL 25 mg daily for nonsustained PSVT with recommendations to proceed with a 30-day monitor if symptoms proceed.  Statin therapy was discussed however patient was reluctant.  Most recent LDL on 09/07/2018 was 85 down from 144 on 05/28/2018.  Creatinine was stable at 0.74.   Today, Mikayla Mccoy is doing well from a cardiac perspective. Continues to have intermittent episodes of PSVT.  She wears an Audiological scientist with cardiac capabilities.  She will sometimes notice that her watch alarms for an elevated heart rate and will not to be having symptoms while other times she may feel mild palpitations.  Episodes seem to be fleeting.  Happening maybe once every few days.  Reports her episodes have improved after the initiation of metoprolol.  No longer having nocturnal episodes.  Discussed wearing a  30-day monitor for better assessment of rhythm.  For peace of mind, patient would like to pursue this.  She denies chest pain, shortness of breath, PND, LE swelling, orthopnea, dizziness, presyncopal or syncopal episodes.  Has been staying safe from Covid with masking and frequent handwashing.  Discussed performing lab work today given appointment cancellation by PCP secondary to Covid precautions.  Past Medical History:  Diagnosis Date   Mitral regurgitation    mildly thickened MV by echo 7/11   Neck pain    Osteopenia    Right hand pain    Shoulder pain, left     History reviewed. No pertinent surgical history.   Current Outpatient Medications  Medication Sig Dispense Refill   aspirin EC 81 MG tablet Take 1 tablet (81 mg total) by mouth daily. 90 tablet 3   conjugated estrogens (PREMARIN) vaginal cream Place 1 Applicatorful vaginally as needed.      FIBER PO Take 1 tablet by mouth daily.     ibuprofen (ADVIL,MOTRIN) 200 MG tablet Take 400 mg by mouth 3 (three) times daily as needed for headache or moderate pain.     metoprolol succinate (TOPROL-XL) 25 MG 24 hr tablet TAKE 1 TABLET BY MOUTH EVERY DAY 90 tablet 2   Multiple Vitamins-Minerals (MULTIVITAMIN PO) Take 1 tablet by mouth daily.     rosuvastatin (CRESTOR) 10 MG tablet Take 1 tablet (10 mg total) by mouth daily. 90 tablet 1   No current facility-administered medications for this visit.     Allergies:   Codeine    Social  History:  The patient  reports that she has never smoked. She has never used smokeless tobacco. She reports current alcohol use. She reports that she does not use drugs.   Family History:  The patient's family history includes Congenital heart disease in her brother; Diabetes in her sister; Healthy in her brother, brother, daughter, son, and son; Hypertension in her brother, father, sister, and sister; Hypertrophic cardiomyopathy in her brother; Kidney disease in her sister; Lung disease in her  father; Melanoma in her sister; Other in her mother; Parkinson's disease in her sister; Squamous cell carcinoma in her sister.    ROS:  Please see the history of present illness.   Otherwise, review of systems are positive for none.  All other systems are reviewed and negative.    PHYSICAL EXAM: VS:  BP 122/76    Pulse 72    Ht 5' 7"  (1.702 m)    Wt 140 lb (63.5 kg)    SpO2 99%    BMI 21.93 kg/m  , BMI Body mass index is 21.93 kg/m.    General: Well developed, well nourished, NAD Lungs:Clear to ausculation bilaterally. No wheezes, rales, or rhonchi. Breathing is unlabored. Cardiovascular: RRR with S1 S2. No murmurs Extremities: No edema. PT pulses 2+ bilaterally Neuro: Alert and oriented. No focal deficits. No facial asymmetry. MAE spontaneously. Psych: Responds to questions appropriately with normal affect.     EKG:  EKG is ordered today. The ekg ordered today demonstrates NSR, HR 62 bpm with no evidence of PVCs or PACs.  No acute changes.   Recent Labs: No results found for requested labs within last 8760 hours.    Lipid Panel    Component Value Date/Time   CHOL 181 09/07/2018 0902   TRIG 84 09/07/2018 0902   HDL 79 09/07/2018 0902   CHOLHDL 2.3 09/07/2018 0902   LDLCALC 85 09/07/2018 0902      Wt Readings from Last 3 Encounters:  11/11/19 140 lb (63.5 kg)  07/29/18 138 lb (62.6 kg)  05/26/18 137 lb 12.8 oz (62.5 kg)      Other studies Reviewed: Additional studies/ records that were reviewed today include:  48-hour Holter monitor 02/2018: Study Highlights     Basic underlying rhythm is normal sinus  PVCs and PACs are noted.  No sustained arrhythmias are noted.  Nonsustained SVT up to 157 bpm, longest episode 16 beats.  No symptoms recorded.  Basic underlying rhythm is normal sinus rhythm. Nonsustained SVT up to 16 beats. Isolated premature ventricular contractions.  Minimum HR: 48 BPM at 3:17:36 AM(2) Maximum HR: 152 BPM at 5:14:55 PM Average  HR: 86 BPM   Exercise treadmill test 03/18/2018: Study Highlights     Blood pressure demonstrated a normal response to exercise.  There was no ST segment deviation noted during stress. Occasional PVC's  8 minutes exercise time. No CP.  Low risk exercise treadmill test with no electrocardiographic evidence of ischemia.    ASSESSMENT AND PLAN:  1.  PSVT: -More controlled on Toprol-XL 25 mg p.o. daily -Recommendations per Dr. Tamala Julian for for 30-day monitor placement if symptoms persist>>> will place 30-day monitor for better assessment of symptoms, PSVT. -Reports symptoms have improved since initiation of Toprol, no longer having nocturnal symptoms -May need further titration if PSVT burden high  2.  History of chest tightness: -Recent coronary CTA attempted however given palpitations this was never performed therefore, coronary calcium score was performed on 07/29/2018 with a coronary calcium score of 23, 66th percentile for age  and sex matched control -No recurrent symptoms  3.  Mitral regurgitation: -Last echocardiogram dated 06/2010 with no recent study available -Clinically insignificant, no symptoms  Current medicines are reviewed at length with the patient today.  The patient does not have concerns regarding medicines.  The following changes have been made:  no change  Labs/ tests ordered today include: Lipid, CMET, 30-day monitor   Orders Placed This Encounter  Procedures   Lipid Profile   Comp Met (CMET)   Cardiac event monitor   EKG 12-Lead    Disposition:   FU with Dr. Tamala Julian in 1 year or sooner if needed  Signed, Kathyrn Drown, NP  11/11/2019 10:05 AM    [address], [place], [place]  [address] Phone: [phone]; Fax: [phone]

## 2019-11-10 ENCOUNTER — Other Ambulatory Visit: Payer: Self-pay | Admitting: Interventional Cardiology

## 2019-11-11 ENCOUNTER — Encounter: Payer: Self-pay | Admitting: Cardiology

## 2019-11-11 ENCOUNTER — Other Ambulatory Visit: Payer: Self-pay

## 2019-11-11 ENCOUNTER — Ambulatory Visit (INDEPENDENT_AMBULATORY_CARE_PROVIDER_SITE_OTHER): Payer: BC Managed Care – PPO | Admitting: Cardiology

## 2019-11-11 VITALS — BP 122/76 | HR 72 | Ht 67.0 in | Wt 140.0 lb

## 2019-11-11 DIAGNOSIS — R002 Palpitations: Secondary | ICD-10-CM

## 2019-11-11 DIAGNOSIS — I471 Supraventricular tachycardia: Secondary | ICD-10-CM | POA: Diagnosis not present

## 2019-11-11 DIAGNOSIS — E785 Hyperlipidemia, unspecified: Secondary | ICD-10-CM

## 2019-11-11 DIAGNOSIS — I340 Nonrheumatic mitral (valve) insufficiency: Secondary | ICD-10-CM | POA: Diagnosis not present

## 2019-11-11 LAB — COMPREHENSIVE METABOLIC PANEL
ALT: 19 IU/L (ref 0–32)
AST: 25 IU/L (ref 0–40)
Albumin/Globulin Ratio: 2.6 — ABNORMAL HIGH (ref 1.2–2.2)
Albumin: 4.4 g/dL (ref 3.8–4.8)
Alkaline Phosphatase: 85 IU/L (ref 39–117)
BUN/Creatinine Ratio: 20 (ref 12–28)
BUN: 16 mg/dL (ref 8–27)
Bilirubin Total: 0.5 mg/dL (ref 0.0–1.2)
CO2: 25 mmol/L (ref 20–29)
Calcium: 9.5 mg/dL (ref 8.7–10.3)
Chloride: 104 mmol/L (ref 96–106)
Creatinine, Ser: 0.81 mg/dL (ref 0.57–1.00)
GFR calc Af Amer: 87 mL/min/{1.73_m2} (ref >59)
GFR calc non Af Amer: 75 mL/min/{1.73_m2} (ref >59)
Globulin, Total: 1.7 g/dL (ref 1.5–4.5)
Glucose: 91 mg/dL (ref 65–99)
Potassium: 4.7 mmol/L (ref 3.5–5.2)
Sodium: 141 mmol/L (ref 134–144)
Total Protein: 6.1 g/dL (ref 6.0–8.5)

## 2019-11-11 LAB — LIPID PANEL
Chol/HDL Ratio: 2.4 ratio (ref 0.0–4.4)
Cholesterol, Total: 177 mg/dL (ref 100–199)
HDL: 75 mg/dL (ref >39)
LDL Chol Calc (NIH): 70 mg/dL (ref 0–99)
Triglycerides: 197 mg/dL — ABNORMAL HIGH (ref 0–149)
VLDL Cholesterol Cal: 32 mg/dL (ref 5–40)

## 2019-11-11 NOTE — Patient Instructions (Signed)
Medication Instructions:   Your physician recommends that you continue on your current medications as directed. Please refer to the Current Medication list given to you today.  *If you need a refill on your cardiac medications before your next appointment, please call your pharmacy*  Lab Work:  You will have labs drawn today: CMET and Non fasting lipid profile  If you have labs (blood work) drawn today and your tests are completely normal, you will receive your results only by: Marland Kitchen MyChart Message (if you have MyChart) OR . A paper copy in the mail If you have any lab test that is abnormal or we need to change your treatment, we will call you to review the results.  Testing/Procedures:  [employer] Cardiac Event Monitor Instructions  Your physician has requested you wear your cardiac event monitor for _30_ days, [employer] may call or text to confirm a shipping address. The monitor will be sent to a land address via UPS. [employer] will not ship a monitor to a PO BOX. It typically takes 3-5 days to receive your monitor after it has been enrolled. [employer] will assist with USPS tracking if your package is delayed. The telephone number for [employer] is [phone]. Once you have received your monitor, please review the enclosed instructions. Instruction tutorials can also be viewed under help and settings on the enclosed cell phone. Your monitor has already been registered assigning a specific monitor serial # to you.  Applying the monitor Remove cell phone from case and turn it on. The cell phone works as Dealer and needs to be within [employer] of you at all times. The cell phone will need to be charged on a daily basis. We recommend you plug the cell phone into the enclosed charger at your bedside table every night.  Monitor batteries: You will receive two monitor batteries labelled #1 and #2. These are your recorders. Plug battery #2 onto the second connection on the  enclosed charger. Keep one battery on the charger at all times. This will keep the monitor battery deactivated. It will also keep it fully charged for when you need to switch your monitor batteries. A small light will be blinking on the battery emblem when it is charging. The light on the battery emblem will remain on when the battery is fully charged.  Open package of a Monitor strip. Insert battery #1 into black hood on strip and gently squeeze monitor battery onto connection as indicated in instruction booklet. Set aside while preparing skin.  Choose location for your strip, vertical or horizontal, as indicated in the instruction booklet. Shave to remove all hair from location. There cannot be any lotions, oils, powders, or colognes on skin where monitor is to be applied. Wipe skin clean with enclosed Saline wipe. Dry skin completely.  Peel paper labeled #1 off the back of the Monitor strip exposing the adhesive. Place the monitor on the chest in the vertical or horizontal position shown in the instruction booklet. One arrow on the monitor strip must be pointing upward. Carefully remove paper labeled #2, attaching remainder of strip to your skin. Try not to create any folds or wrinkles in the strip as you apply it.  Firmly press and release the circle in the center of the monitor battery. You will hear a small beep. This is turning the monitor battery on. The heart emblem on the monitor battery will light up every 5 seconds if the monitor battery in turned on and connected to the patient  securely. Do not push and hold the circle down as this turns the monitor battery off. The cell phone will locate the monitor battery. A screen will appear on the cell phone checking the connection of your monitor strip. This may read poor connection initially but change to good connection within the next minute. Once your monitor accepts the connection you will hear a series of 3 beeps followed by a  climbing crescendo of beeps. A screen will appear on the cell phone showing the two monitor strip placement options. Touch the picture that demonstrates where you applied the monitor strip.  Your monitor strip and battery are waterproof. You are able to shower, bathe, or swim with the monitor on. They just ask you do not submerge deeper than 3 feet underwater. We recommend removing the monitor if you are swimming in a lake, river, or ocean.  Your monitor battery will need to be switched to a fully charged monitor battery approximately once a week. The cell phone will alert you of an action which needs to be made.  On the cell phone, tap for details to reveal connection status, monitor battery status, and cell phone battery status. The green dots indicates your monitor is in good status. A red dot indicates there is something that needs your attention.  To record a symptom, click the circle on the monitor battery. In 30-60 seconds a list of symptoms will appear on the cell phone. Select your symptom and tap save. Your monitor will record a sustained or significant arrhythmia regardless of you clicking the button. Some patients do not feel the heart rhythm irregularities. [employer] will notify us of any serious or critical events.  Refer to instruction booklet for instructions on switching batteries, changing strips, the Do not disturb or Pause features, or any additional questions.  Call [employer] at [phone], to confirm your monitor is transmitting and record your baseline. They will answer any questions you may have regarding the monitor instructions at that time.  Returning the monitor to [employer] Place all equipment back into blue box. Peel off strip of paper to expose adhesive and close box securely. There is a prepaid UPS shipping label on this box. Drop in a UPS drop box, or at a UPS facility like [employer]. You may also contact [employer] to arrange UPS to pick up monitor  package at your home.   Follow-Up: At [facility], you and your health needs are our priority.  As part of our continuing mission to provide you with exceptional heart care, we have created designated Provider Care Teams.  These Care Teams include your primary Cardiologist (physician) and Advanced Practice Providers (APPs -  Physician Assistants and Nurse Practitioners) who all work together to provide you with the care you need, when you need it.  Your next appointment:   12 months  The format for your next appointment:   In Person  Provider:   You may see Lesleigh Noe, MD or one of the following Advanced Practice Providers on your designated Care Team:    Norma Fredrickson, NP  Nada Boozer, NP  Georgie Chard, NP

## 2019-11-15 ENCOUNTER — Telehealth: Payer: Self-pay | Admitting: *Deleted

## 2019-11-15 NOTE — Telephone Encounter (Signed)
[employer] to ship a 30 day cardiac event monitor to her home.  Instructions included in the monitor kit. 

## 2019-11-22 ENCOUNTER — Ambulatory Visit (INDEPENDENT_AMBULATORY_CARE_PROVIDER_SITE_OTHER): Payer: BC Managed Care – PPO

## 2019-11-22 DIAGNOSIS — R002 Palpitations: Secondary | ICD-10-CM

## 2019-11-22 DIAGNOSIS — E785 Hyperlipidemia, unspecified: Secondary | ICD-10-CM

## 2019-11-22 DIAGNOSIS — I471 Supraventricular tachycardia: Secondary | ICD-10-CM

## 2020-01-07 ENCOUNTER — Other Ambulatory Visit: Payer: Self-pay | Admitting: Interventional Cardiology

## 2020-04-13 ENCOUNTER — Other Ambulatory Visit: Payer: Self-pay | Admitting: Interventional Cardiology

## 2020-10-31 ENCOUNTER — Other Ambulatory Visit: Payer: Self-pay | Admitting: Interventional Cardiology

## 2020-12-11 ENCOUNTER — Ambulatory Visit: Payer: BC Managed Care – PPO | Admitting: Interventional Cardiology

## 2020-12-30 NOTE — Progress Notes (Deleted)
Cardiology Office Note:    Date:  12/30/2020   ID:  Mikayla Mccoy, DOB 1951-10-02, MRN [id number]  PCP:  Juluis Rainier, MD  Cardiologist:  Lesleigh Noe, MD   Referring MD: Juluis Rainier, MD   No chief complaint on file.   History of Present Illness:    Mikayla Mccoy is a 70 y.o. female with a hx of non sustained PSVT, elevated low coronary calcium score 23,   Past Medical History:  Diagnosis Date  . Mitral regurgitation    mildly thickened MV by echo 7/11  . Neck pain   . Osteopenia   . Right hand pain   . Shoulder pain, left     No past surgical history on file.  Current Medications: No outpatient medications have been marked as taking for the 01/01/21 encounter (Appointment) with Lyn Records, MD.     Allergies:   Codeine   Social History   Socioeconomic History  . Marital status: Married    Spouse name: Not on file  . Number of children: Not on file  . Years of education: Not on file  . Highest education level: Not on file  Occupational History  . Not on file  Tobacco Use  . Smoking status: Never Smoker  . Smokeless tobacco: Never Used  Vaping Use  . Vaping Use: Never used  Substance and Sexual Activity  . Alcohol use: Yes    Comment: 1-2 per week  . Drug use: No  . Sexual activity: Not on file  Other Topics Concern  . Not on file  Social History Narrative   Tobacco use cigarettes: Never smoked   Tobacco history last updated 04/14/2014   No smoking   Alcohol: occasionally   Caffeine: yes    Occupation:part-time Research officer, political party Status : married   Children 2 sons,1 daughter               Social Determinants of Health   Financial Resource Strain: Not on file  Food Insecurity: Not on file  Transportation Needs: Not on file  Physical Activity: Not on file  Stress: Not on file  Social Connections: Not on file     Family History: The patient's family history includes Congenital heart disease in her  brother; Diabetes in her sister; Healthy in her brother, brother, daughter, son, and son; Hypertension in her brother, father, sister, and sister; Hypertrophic cardiomyopathy in her brother; Kidney disease in her sister; Lung disease in her father; Melanoma in her sister; Other in her mother; Parkinson's disease in her sister; Squamous cell carcinoma in her sister.  ROS:   Please see the history of present illness.    *** All other systems reviewed and are negative.  EKGs/Labs/Other Studies Reviewed:    The following studies were reviewed today:  CARDIAC MONITOR 10/2019: Study Highlights   NSR and sinus bradycardia  Baseline artefact made interpretation difficult  PAC's  Wandering atrial pacemaker rhythm at times vs multiple PAC's  No atrial fibrillation   COR CA Score 06/2018: IMPRESSION: Coronary calcium score of 23. This was 38 th percentile for age and sex matched control.  EXERCISE TREADMILL TEST 02/2018: Study Highlights    Blood pressure demonstrated a normal response to exercise.  There was no ST segment deviation noted during stress. Occasional PVC's  8 minutes exercise time. No CP.  Low risk exercise treadmill test with no electrocardiographic evidence of ischemia.     EKG:  EKG ***  Recent Labs:  No results found for requested labs within last 8760 hours.  Recent Lipid Panel    Component Value Date/Time   CHOL 177 11/11/2019 1013   TRIG 197 (H) 11/11/2019 1013   HDL 75 11/11/2019 1013   CHOLHDL 2.4 11/11/2019 1013   LDLCALC 70 11/11/2019 1013    Physical Exam:    VS:  There were no vitals taken for this visit.    Wt Readings from Last 3 Encounters:  11/11/19 140 lb (63.5 kg)  07/29/18 138 lb (62.6 kg)  05/26/18 137 lb 12.8 oz (62.5 kg)     GEN: ***. No acute distress HEENT: Normal NECK: No JVD. LYMPHATICS: No lymphadenopathy CARDIAC: *** murmur. RRR *** gallop, or edema. VASCULAR: *** Normal Pulses. No bruits. RESPIRATORY:  Clear to  auscultation without rales, wheezing or rhonchi  ABDOMEN: Soft, non-tender, non-distended, No pulsatile mass, MUSCULOSKELETAL: No deformity  SKIN: Warm and dry NEUROLOGIC:  Alert and oriented x 3 PSYCHIATRIC:  Normal affect   ASSESSMENT:    1. Chest tightness   2. PSVT (paroxysmal supraventricular tachycardia) (HCC)   3. Hyperlipidemia, unspecified hyperlipidemia type   4. Nonrheumatic mitral valve regurgitation   5. Educated about COVID-19 virus infection    PLAN:    In order of problems listed above:  1. ***   Medication Adjustments/Labs and Tests Ordered: Current medicines are reviewed at length with the patient today.  Concerns regarding medicines are outlined above.  No orders of the defined types were placed in this encounter.  No orders of the defined types were placed in this encounter.   There are no Patient Instructions on file for this visit.   Signed, Lesleigh Noe, MD  12/30/2020 2:31 PM    [facility]

## 2021-01-01 ENCOUNTER — Ambulatory Visit: Payer: BC Managed Care – PPO | Admitting: Interventional Cardiology

## 2021-01-01 DIAGNOSIS — E785 Hyperlipidemia, unspecified: Secondary | ICD-10-CM

## 2021-01-01 DIAGNOSIS — I340 Nonrheumatic mitral (valve) insufficiency: Secondary | ICD-10-CM

## 2021-01-01 DIAGNOSIS — R0789 Other chest pain: Secondary | ICD-10-CM

## 2021-01-01 DIAGNOSIS — Z7189 Other specified counseling: Secondary | ICD-10-CM

## 2021-01-01 DIAGNOSIS — I471 Supraventricular tachycardia: Secondary | ICD-10-CM

## 2021-01-22 ENCOUNTER — Other Ambulatory Visit: Payer: Self-pay | Admitting: Interventional Cardiology

## 2021-01-30 ENCOUNTER — Other Ambulatory Visit: Payer: Self-pay | Admitting: Interventional Cardiology

## 2021-04-10 NOTE — Progress Notes (Signed)
Cardiology Office Note:    Date:  04/11/2021   ID:  Mikayla Mccoy, DOB Aug 24, 1951, MRN [id number]  PCP:  Juluis Rainier, MD  Cardiologist:  Lesleigh Noe, MD   Referring MD: Juluis Rainier, MD   Chief Complaint  Patient presents with  . Coronary Artery Disease  . Hypertension  . Hyperlipidemia    History of Present Illness:    Mikayla Mccoy is a 70 y.o. female with a hx of chest pain, prediabetes, new diagnosis of obstructive sleep apnea, coronary calcium score 23 (2019), mitral regurgitation and history of PSVT.  Asymptomatic coronary disease without any symptoms of angina pectoris.  Treating risk factors of relevance.  Found out this year that she has an elevated hemoglobin A1c.  Medical therapy is being used at this time.  Rare palpitations.  Past Medical History:  Diagnosis Date  . Mitral regurgitation    mildly thickened MV by echo 7/11  . Neck pain   . Osteopenia   . Right hand pain   . Shoulder pain, left     History reviewed. No pertinent surgical history.  Current Medications: Current Meds  Medication Sig  . aspirin EC 81 MG tablet Take 1 tablet (81 mg total) by mouth daily.  Marland Kitchen FIBER PO Take 1 tablet by mouth daily.  Marland Kitchen ibuprofen (ADVIL,MOTRIN) 200 MG tablet Take 400 mg by mouth 3 (three) times daily as needed for headache or moderate pain.  . metoprolol succinate (TOPROL-XL) 25 MG 24 hr tablet Take 1 tablet (25 mg total) by mouth daily. Please keep upcoming appt in April 2022 with Dr. Katrinka Blazing before anymore refills. Thank you  . Multiple Vitamins-Minerals (MULTIVITAMIN PO) Take 1 tablet by mouth daily.  . rosuvastatin (CRESTOR) 10 MG tablet TAKE 1 TABLET (10 MG TOTAL) BY MOUTH DAILY. PLEASE KEEP UPCOMING APPT IN DECEMBER WITH DR. Katrinka Blazing FOR FUTURE REFILLS. THANK YOU     Allergies:   Codeine   Social History   Socioeconomic History  . Marital status: Married    Spouse name: Not on file  . Number of children: Not on file  . Years of education:  Not on file  . Highest education level: Not on file  Occupational History  . Not on file  Tobacco Use  . Smoking status: Never Smoker  . Smokeless tobacco: Never Used  Vaping Use  . Vaping Use: Never used  Substance and Sexual Activity  . Alcohol use: Yes    Comment: 1-2 per week  . Drug use: No  . Sexual activity: Not on file  Other Topics Concern  . Not on file  Social History Narrative   Tobacco use cigarettes: Never smoked   Tobacco history last updated 04/14/2014   No smoking   Alcohol: occasionally   Caffeine: yes    Occupation:part-time Research officer, political party Status : married   Children 2 sons,1 daughter               Social Determinants of Health   Financial Resource Strain: Not on file  Food Insecurity: Not on file  Transportation Needs: Not on file  Physical Activity: Not on file  Stress: Not on file  Social Connections: Not on file     Family History: The patient's family history includes Congenital heart disease in her brother; Diabetes in her sister; Healthy in her brother, brother, daughter, son, and son; Hypertension in her brother, father, sister, and sister; Hypertrophic cardiomyopathy in her brother; Kidney disease in her sister; Lung  disease in her father; Melanoma in her sister; Other in her mother; Parkinson's disease in her sister; Squamous cell carcinoma in her sister.  ROS:   Please see the history of present illness.    Occasional ankle swelling.  Toenail fungus.  She denies angina.  Exercises several days a week.  Her EKG is unchanged from prior.  All other systems reviewed and are negative.  EKGs/Labs/Other Studies Reviewed:    The following studies were reviewed today:  72 Hour Monitor 2019: Study Highlights   NSR and sinus bradycardia  Baseline artefact made interpretation difficult  PAC's  Wandering atrial pacemaker rhythm at times vs multiple PAC's  No atrial fibrillation    EKG:  EKG normal sinus  rhythm with leftward axis and left atrial abnormality.  Low voltage.  When compared to prior tracings,  Recent Labs: No results found for requested labs within last 8760 hours.  Recent Lipid Panel    Component Value Date/Time   CHOL 177 11/11/2019 1013   TRIG 197 (H) 11/11/2019 1013   HDL 75 11/11/2019 1013   CHOLHDL 2.4 11/11/2019 1013   LDLCALC 70 11/11/2019 1013    Physical Exam:    VS:  BP 140/72   Pulse 68   Ht 5\' 7"  (1.702 m)   Wt 132 lb 12.8 oz (60.2 kg)   SpO2 97%   BMI 20.80 kg/m     Wt Readings from Last 3 Encounters:  04/11/21 132 lb 12.8 oz (60.2 kg)  11/11/19 140 lb (63.5 kg)  07/29/18 138 lb (62.6 kg)     GEN: Slender. No acute distress HEENT: Normal NECK: No JVD. LYMPHATICS: No lymphadenopathy CARDIAC: No murmur. RRR S4 gallop, or edema. VASCULAR:  Normal Pulses. No bruits. RESPIRATORY:  Clear to auscultation without rales, wheezing or rhonchi  ABDOMEN: Soft, non-tender, non-distended, No pulsatile mass, MUSCULOSKELETAL: No deformity  SKIN: Warm and dry NEUROLOGIC:  Alert and oriented x 3 PSYCHIATRIC:  Normal affect   ASSESSMENT:    1. PSVT (paroxysmal supraventricular tachycardia) (HCC)   2. Hyperlipidemia, unspecified hyperlipidemia type   3. Nonrheumatic mitral valve regurgitation   4. Prediabetes   5. Elevated blood pressure reading   6. Obstructive sleep apnea on CPAP    PLAN:    In order of problems listed above:  1. Controlled with low-dose beta-blocker therapy 2. Most recent LDL cholesterol was 68 on 10 mg of rosuvastatin daily.  She will pause therapy to treat nail fungus.  She will resume after that. 3. No auscultatory evidence of significant mitral valve disease. 4. Discussed limiting carbohydrates/alcohol as a means to control the A1c. 5. Decrease sodium in diet.  Decrease alcohol intake.  Target 130/80 mmHg. 6. Encourage CPAP use.   Clinical f/u in 1 year.  Overall education and awareness concerning primary/secondary risk  prevention was discussed in detail: LDL less than 70, hemoglobin A1c less than 7, blood pressure target less than 130/80 mmHg, >150 minutes of moderate aerobic activity per week, avoidance of smoking, weight control (via diet and exercise), and continued surveillance/management of/for obstructive sleep apnea.    Medication Adjustments/Labs and Tests Ordered: Current medicines are reviewed at length with the patient today.  Concerns regarding medicines are outlined above.  Orders Placed This Encounter  Procedures  . EKG 12-Lead   No orders of the defined types were placed in this encounter.   Patient Instructions  Medication Instructions:  Your physician recommends that you continue on your current medications as directed. Please refer to the Current  Medication list given to you today.  *If you need a refill on your cardiac medications before your next appointment, please call your pharmacy*   Lab Work: None If you have labs (blood work) drawn today and your tests are completely normal, you will receive your results only by: Marland Kitchen MyChart Message (if you have MyChart) OR . A paper copy in the mail If you have any lab test that is abnormal or we need to change your treatment, we will call you to review the results.   Testing/Procedures: None   Follow-Up: At [facility], you and your health needs are our priority.  As part of our continuing mission to provide you with exceptional heart care, we have created designated Provider Care Teams.  These Care Teams include your primary Cardiologist (physician) and Advanced Practice Providers (APPs -  Physician Assistants and Nurse Practitioners) who all work together to provide you with the care you need, when you need it.  We recommend signing up for the patient portal called "MyChart".  Sign up information is provided on this After Visit Summary.  MyChart is used to connect with patients for Virtual Visits (Telemedicine).  Patients are able  to view lab/test results, encounter notes, upcoming appointments, etc.  Non-urgent messages can be sent to your provider as well.   To learn more about what you can do with MyChart, go to [email or web address].    Your next appointment:   1 year(s)  The format for your next appointment:   In Person  Provider:   You may see Lesleigh Noe, MD or one of the following Advanced Practice Providers on your designated Care Team:    Georgie Chard, NP    Other Instructions      Signed, Lesleigh Noe, MD  04/11/2021 2:16 PM     [facility]

## 2021-04-11 ENCOUNTER — Ambulatory Visit (INDEPENDENT_AMBULATORY_CARE_PROVIDER_SITE_OTHER): Payer: Medicare Other | Admitting: Interventional Cardiology

## 2021-04-11 ENCOUNTER — Encounter: Payer: Self-pay | Admitting: Interventional Cardiology

## 2021-04-11 ENCOUNTER — Other Ambulatory Visit: Payer: Self-pay

## 2021-04-11 VITALS — BP 140/72 | HR 68 | Ht 67.0 in | Wt 132.8 lb

## 2021-04-11 DIAGNOSIS — G4733 Obstructive sleep apnea (adult) (pediatric): Secondary | ICD-10-CM

## 2021-04-11 DIAGNOSIS — R7303 Prediabetes: Secondary | ICD-10-CM | POA: Diagnosis not present

## 2021-04-11 DIAGNOSIS — Z9989 Dependence on other enabling machines and devices: Secondary | ICD-10-CM

## 2021-04-11 DIAGNOSIS — I340 Nonrheumatic mitral (valve) insufficiency: Secondary | ICD-10-CM | POA: Diagnosis not present

## 2021-04-11 DIAGNOSIS — E785 Hyperlipidemia, unspecified: Secondary | ICD-10-CM

## 2021-04-11 DIAGNOSIS — I471 Supraventricular tachycardia: Secondary | ICD-10-CM

## 2021-04-11 DIAGNOSIS — R030 Elevated blood-pressure reading, without diagnosis of hypertension: Secondary | ICD-10-CM

## 2021-04-11 NOTE — Patient Instructions (Signed)

## 2021-04-25 ENCOUNTER — Other Ambulatory Visit: Payer: Self-pay | Admitting: Interventional Cardiology

## 2021-04-26 ENCOUNTER — Other Ambulatory Visit: Payer: Self-pay | Admitting: Interventional Cardiology

## 2021-12-03 ENCOUNTER — Other Ambulatory Visit: Payer: Self-pay | Admitting: Family Medicine

## 2021-12-03 ENCOUNTER — Ambulatory Visit
Admission: RE | Admit: 2021-12-03 | Discharge: 2021-12-03 | Disposition: A | Discharge status: Home / Self Care | Payer: Medicare Other | Source: Ambulatory Visit | Attending: Family Medicine | Admitting: Family Medicine

## 2021-12-03 ENCOUNTER — Other Ambulatory Visit: Payer: Self-pay

## 2021-12-03 DIAGNOSIS — J069 Acute upper respiratory infection, unspecified: Secondary | ICD-10-CM

## 2021-12-03 DIAGNOSIS — R059 Cough, unspecified: Secondary | ICD-10-CM

## 2022-02-28 IMAGING — CR DG CHEST 2V
2 series · 2 of 2 positions shown · non-contrast
Comparison: CT heart calcium scoring 07/29/2018

CLINICAL DATA: Cough.  Upper respiratory infection.

EXAM:
CHEST - 2 VIEW

[w chest pa]
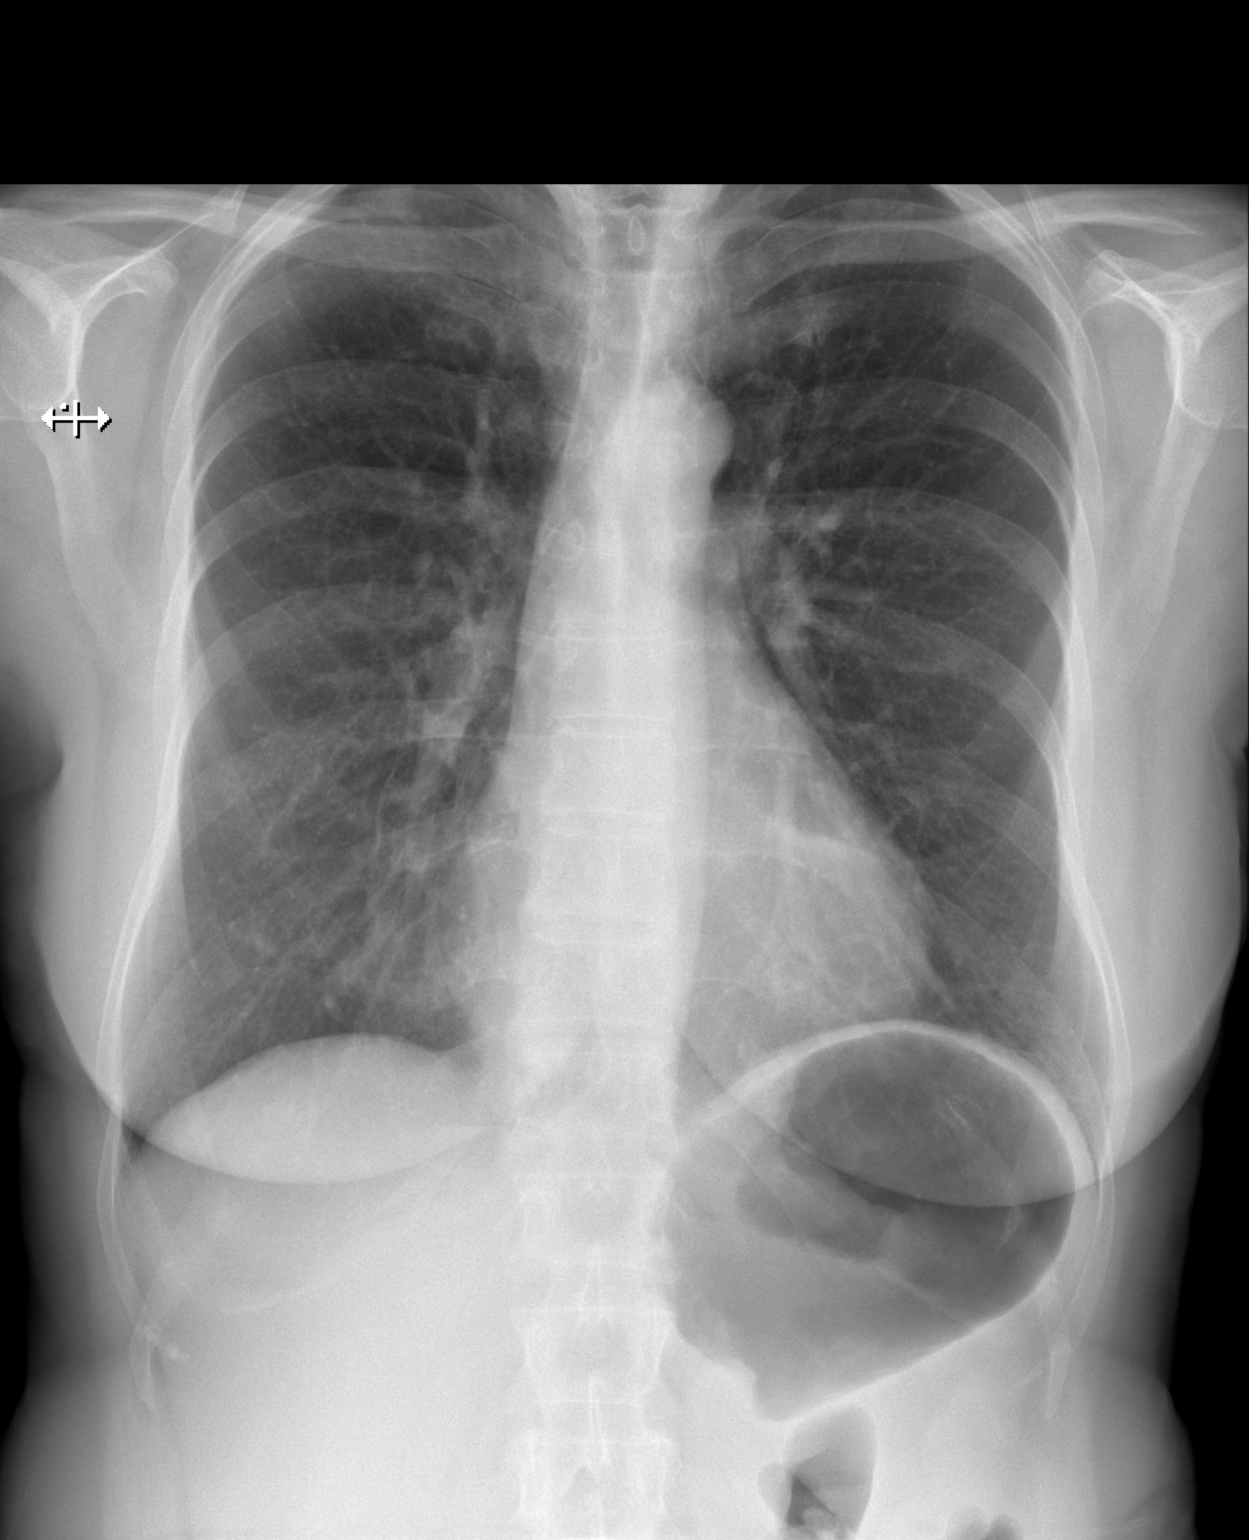

[w chest lat]
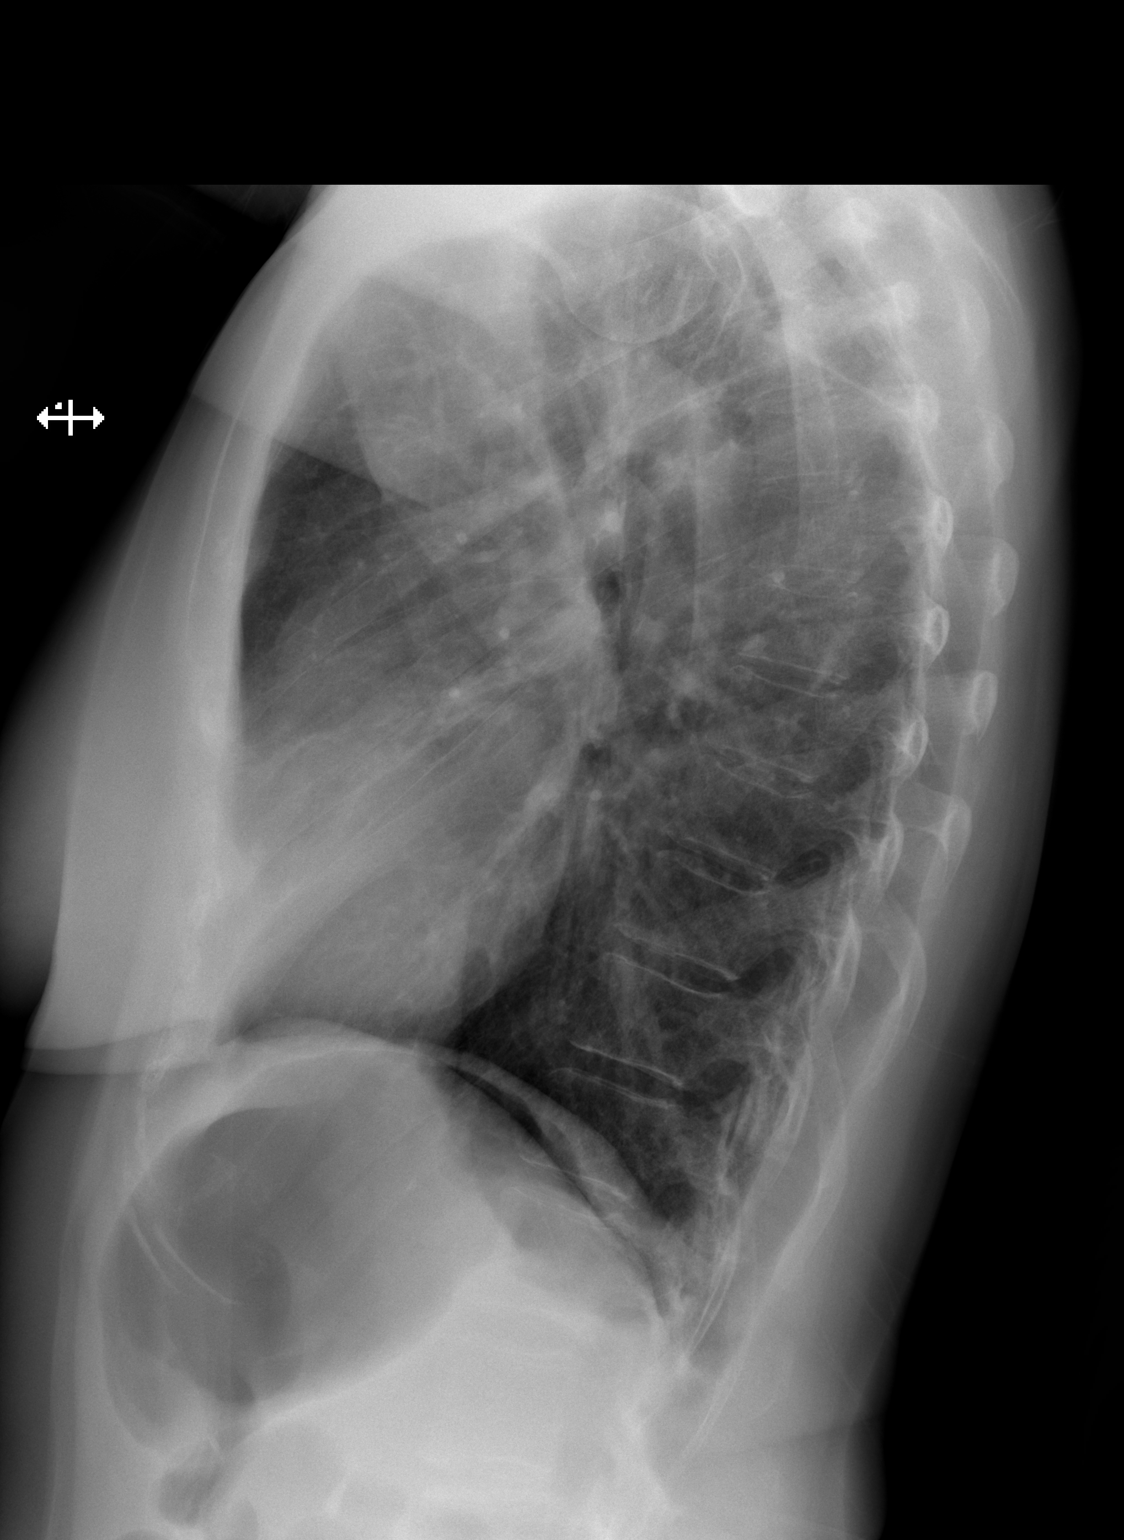

[2 of 2 positions shown; findings below may reference images not displayed]

FINDINGS: Heart is upper limits of normal for size. Lungs are clear. No edema
or effusion is present. No focal airspace disease is present. Axial
skeleton is within normal limits.
IMPRESSION: Borderline cardiomegaly without failure.  Lungs are clear.

## 2022-04-25 ENCOUNTER — Other Ambulatory Visit: Payer: Self-pay | Admitting: Interventional Cardiology

## 2022-04-27 NOTE — Progress Notes (Signed)
?Cardiology Office Note:   ? ?Date:  04/29/2022  ? ?IDRyelynn Mccoy, DOB 04-Dec-1951, MRN [id number] ? ?PCP:  Sigmund Hazel, MD  ?Cardiologist:  Lesleigh Noe, MD  ? ?Referring MD: Sigmund Hazel, MD  ? ?No chief complaint on file. ? ? ?History of Present Illness:   ? ?Mikayla Mccoy is a 71 y.o. female with a hx of chest pain, prediabetes, new diagnosis of obstructive sleep apnea, coronary calcium score 23 (2019), mitral regurgitation and history of PSVT. ? ? ?Mikayla Mccoy is doing well.  She did have COVID-19 since the last office visit.  Following that she has had a persisting cough and does not feel that she is back to her full capacity from the standpoint of aerobic tolerance. ? ?She denies quality of life issues.  She has rare palpitations.  No sustained arrhythmia.  She denies claudication.  They have a three-story house and she is up and down multiple flights most every day without limitations. ? ?Past Medical History:  ?Diagnosis Date  ? Mitral regurgitation   ? mildly thickened MV by echo 7/11  ? Neck pain   ? Osteopenia   ? Right hand pain   ? Shoulder pain, left   ? ? ?History reviewed. No pertinent surgical history. ? ?Current Medications: ?Current Meds  ?Medication Sig  ? aspirin EC 81 MG tablet Take 1 tablet (81 mg total) by mouth daily.  ? Fexofenadine HCl (ALLEGRA ALLERGY PO) Take 10 mg by mouth daily.  ? FIBER PO Take 1 tablet by mouth daily.  ? ibuprofen (ADVIL,MOTRIN) 200 MG tablet Take 400 mg by mouth 3 (three) times daily as needed for headache or moderate pain.  ? metoprolol succinate (TOPROL-XL) 25 MG 24 hr tablet Take 1 tablet (25 mg total) by mouth daily.  ? Multiple Vitamins-Minerals (MULTIVITAMIN PO) Take 1 tablet by mouth daily.  ? Probiotic Product (ALIGN PO) Take 1 capsule by mouth daily.  ? rosuvastatin (CRESTOR) 10 MG tablet Take 1 tablet (10 mg total) by mouth daily. Appointment needed for future refills  ?  ? ?Allergies:   Codeine  ? ?Social History  ? ?Socioeconomic History  ? Marital  status: Married  ?  Spouse name: Not on file  ? Number of children: Not on file  ? Years of education: Not on file  ? Highest education level: Not on file  ?Occupational History  ? Not on file  ?Tobacco Use  ? Smoking status: Never  ? Smokeless tobacco: Never  ?Vaping Use  ? Vaping Use: Never used  ?Substance and Sexual Activity  ? Alcohol use: Yes  ?  Comment: 1-2 per week  ? Drug use: No  ? Sexual activity: Not on file  ?Other Topics Concern  ? Not on file  ?Social History Narrative  ? Tobacco use cigarettes: Never smoked  ? Tobacco history last updated 04/14/2014  ? No smoking  ? Alcohol: occasionally  ? Caffeine: yes   ? Occupation:part-time Sports coach  ? Martial Status : married  ? Children 2 sons,1 daughter  ?   ?   ?   ?   ? ?Social Determinants of Health  ? ?Financial Resource Strain: Not on file  ?Food Insecurity: Not on file  ?Transportation Needs: Not on file  ?Physical Activity: Not on file  ?Stress: Not on file  ?Social Connections: Not on file  ?  ? ?Family History: ?The patient's family history includes Congenital heart disease in her brother; Diabetes in her sister;  Healthy in her brother, brother, daughter, son, and son; Hypertension in her brother, father, sister, and sister; Hypertrophic cardiomyopathy in her brother; Kidney disease in her sister; Lung disease in her father; Melanoma in her sister; Other in her mother; Parkinson's disease in her sister; Squamous cell carcinoma in her sister. ? ?ROS:   ?Please see the history of present illness.    ?Denies chest pain, syncope, medication side effects, and syncope.  All other systems reviewed and are negative. ? ?EKGs/Labs/Other Studies Reviewed:   ? ?The following studies were reviewed today: ? ?Coronary calcium score 07/29/2018: ?IMPRESSION: ?Coronary calcium score of 23. This was 2566 th percentile for age and ?sex matched control. ? ?EKG:  EKG sinus bradycardia with incomplete right bundle.  Compared to April 2022 heart rate and  morphology are unchanged. ? ?Recent Labs: ?No results found for requested labs within last 8760 hours.  ?Recent Lipid Panel ?   ?Component Value Date/Time  ? CHOL 177 11/11/2019 1013  ? TRIG 197 (H) 11/11/2019 1013  ? HDL 75 11/11/2019 1013  ? CHOLHDL 2.4 11/11/2019 1013  ? LDLCALC 70 11/11/2019 1013  ? ? ?Physical Exam:   ? ?VS:  BP 128/70   Pulse (!) 55   Ht 5\' 7"  (1.702 m)   Wt 133 lb (60.3 kg)   SpO2 99%   BMI 20.83 kg/m?    ? ?Wt Readings from Last 3 Encounters:  ?04/29/22 133 lb (60.3 kg)  ?04/11/21 132 lb 12.8 oz (60.2 kg)  ?11/11/19 140 lb (63.5 kg)  ?  ? ?GEN: Healthy appearing. No acute distress ?HEENT: Normal ?NECK: No JVD. ?LYMPHATICS: No lymphadenopathy ?CARDIAC: No murmur. RRR S4 gallop, or edema. ?VASCULAR:  Normal Pulses. No bruits. ?RESPIRATORY:  Clear to auscultation without rales, wheezing or rhonchi  ?ABDOMEN: Soft, non-tender, non-distended, No pulsatile mass, ?MUSCULOSKELETAL: No deformity  ?SKIN: Warm and dry ?NEUROLOGIC:  Alert and oriented x 3 ?PSYCHIATRIC:  Normal affect  ? ?ASSESSMENT:   ? ?1. PSVT (paroxysmal supraventricular tachycardia) (HCC)   ?2. Hyperlipidemia, unspecified hyperlipidemia type   ?3. Nonrheumatic mitral valve regurgitation   ?4. Prediabetes   ?5. Agatston coronary artery calcium score less than 100   ?6. Obstructive sleep apnea on CPAP   ? ?PLAN:   ? ?In order of problems listed above: ? ?Controlled on low-dose beta-blocker therapy.  No side effects.  Has not run heart rates in the mid 50s since initiating the therapy. ?Continue rosuvastatin 10 mg/day. ?No significant mitral regurgitation is heard on auscultation ?Exercise and diet being used to manage.   ?Because of this mild elevation, she is on rosuvastatin 10 mg/day and has an LDL around 70.  She has high HDL. ?She is compliant with CPAP. ? ?Overall education and awareness concerning secondary risk prevention was discussed in detail: LDL less than 70, hemoglobin A1c less than 7, blood pressure target less than  130/80 mmHg, >150 minutes of moderate aerobic activity per week, avoidance of smoking, weight control (via diet and exercise), and continued surveillance/management of/for obstructive sleep apnea. ? ? ? ? ?Medication Adjustments/Labs and Tests Ordered: ?Current medicines are reviewed at length with the patient today.  Concerns regarding medicines are outlined above.  ?Orders Placed This Encounter  ?Procedures  ? EKG 12-Lead  ? ?No orders of the defined types were placed in this encounter. ? ? ?Patient Instructions  ?Medication Instructions:  ?Your physician recommends that you continue on your current medications as directed. Please refer to the Current Medication list given to you  today. ? ?*If you need a refill on your cardiac medications before your next appointment, please call your pharmacy* ? ?Lab Work: ?NONE ? ?Testing/Procedures: ?NONE ? ?Follow-Up: ?At [facility], you and your health needs are our priority.  As part of our continuing mission to provide you with exceptional heart care, we have created designated Provider Care Teams.  These Care Teams include your primary Cardiologist (physician) and Advanced Practice Providers (APPs -  Physician Assistants and Nurse Practitioners) who all work together to provide you with the care you need, when you need it. ? ?Your next appointment:   ?1 year(s) ? ?The format for your next appointment:   ?In Person ? ?Provider:   ?Lesleigh Noe, MD  ? ? ?Important Information About Sugar ? ? ? ? ?   ? ?Signed, ?Lesleigh Noe, MD  ?04/29/2022 5:11 PM    ?[facility] ?

## 2022-04-29 ENCOUNTER — Ambulatory Visit (INDEPENDENT_AMBULATORY_CARE_PROVIDER_SITE_OTHER): Payer: Medicare Other | Admitting: Interventional Cardiology

## 2022-04-29 ENCOUNTER — Encounter: Payer: Self-pay | Admitting: Interventional Cardiology

## 2022-04-29 VITALS — BP 128/70 | HR 55 | Ht 67.0 in | Wt 133.0 lb

## 2022-04-29 DIAGNOSIS — I340 Nonrheumatic mitral (valve) insufficiency: Secondary | ICD-10-CM

## 2022-04-29 DIAGNOSIS — I471 Supraventricular tachycardia: Secondary | ICD-10-CM

## 2022-04-29 DIAGNOSIS — R7303 Prediabetes: Secondary | ICD-10-CM

## 2022-04-29 DIAGNOSIS — Z9989 Dependence on other enabling machines and devices: Secondary | ICD-10-CM

## 2022-04-29 DIAGNOSIS — E785 Hyperlipidemia, unspecified: Secondary | ICD-10-CM

## 2022-04-29 DIAGNOSIS — G4733 Obstructive sleep apnea (adult) (pediatric): Secondary | ICD-10-CM

## 2022-04-29 DIAGNOSIS — R931 Abnormal findings on diagnostic imaging of heart and coronary circulation: Secondary | ICD-10-CM

## 2022-04-29 NOTE — Patient Instructions (Signed)

## 2022-04-30 ENCOUNTER — Other Ambulatory Visit: Payer: Self-pay | Admitting: Interventional Cardiology

## 2022-05-19 ENCOUNTER — Other Ambulatory Visit: Payer: Self-pay | Admitting: Interventional Cardiology

## 2023-04-27 NOTE — Progress Notes (Unsigned)
Cardiology Office Note:    Date:  04/28/2023   ID:  Mikayla Mccoy, DOB 1951-08-02, MRN [id number]  PCP:  Sigmund Hazel, MD  Cardiologist:  Little Ishikawa, MD  Electrophysiologist:  None   Referring MD: Sigmund Hazel, MD   Chief Complaint  Patient presents with   Chest Pain    History of Present Illness:    Mikayla Mccoy is a 72 y.o. female with a hx of CAD, OSA, SVT who presents for follow-up.  She previously followed with Dr. Katrinka Blazing, last seen 04/29/2022.  ETT 02/2018 showed good exercise capacity (10.1 METS), no evidence of ischemia.  Holter monitor 02/2018 showed no sustained arrhythmias, nonsustained SVT up to 16 beats.  Calcium score 06/2018 was 23 (66 percentile).  30-day monitor 10/2019 showed 10% PACs.  Since last clinic visit, she reports he has been doing okay.  Reports no recent palpitations.  States that it improved when she started CPAP.  She reports compliance with CPAP.  Does report she did have occasional chest pain.  Describes sharp pain in center of chest.  Has not noted relationship with exertion, but does report she has noted dyspnea on exertion recently.  Past Medical History:  Diagnosis Date   Mitral regurgitation    mildly thickened MV by echo 7/11   Neck pain    Osteopenia    Right hand pain    Shoulder pain, left     No past surgical history on file.  Current Medications: Current Meds  Medication Sig   alendronate (FOSAMAX) 70 MG tablet Take 70 mg by mouth once a week.   aspirin EC 81 MG tablet Take 1 tablet (81 mg total) by mouth daily.   Fexofenadine HCl (ALLEGRA ALLERGY PO) Take 10 mg by mouth daily.   FIBER PO Take 1 tablet by mouth daily.   ibuprofen (ADVIL,MOTRIN) 200 MG tablet Take 400 mg by mouth 3 (three) times daily as needed for headache or moderate pain.   metoprolol succinate (TOPROL-XL) 25 MG 24 hr tablet TAKE 1 TABLET (25 MG TOTAL) BY MOUTH DAILY.   metoprolol tartrate (LOPRESSOR) 100 MG tablet Take 1 tablet (100mg ) TWO hours prior  to CT scan   Multiple Vitamins-Minerals (MULTIVITAMIN PO) Take 1 tablet by mouth daily.   OSPHENA 60 MG TABS Take 60 mg by mouth daily.   Probiotic Product (ALIGN PO) Take 1 capsule by mouth daily.   rosuvastatin (CRESTOR) 10 MG tablet Take 1 tablet (10 mg total) by mouth daily.     Allergies:   Codeine   Social History   Socioeconomic History   Marital status: Married    Spouse name: Not on file   Number of children: Not on file   Years of education: Not on file   Highest education level: Not on file  Occupational History   Not on file  Tobacco Use   Smoking status: Never   Smokeless tobacco: Never  Vaping Use   Vaping Use: Never used  Substance and Sexual Activity   Alcohol use: Yes    Comment: 1-2 per week   Drug use: No   Sexual activity: Not on file  Other Topics Concern   Not on file  Social History Narrative   Tobacco use cigarettes: Never smoked   Tobacco history last updated 04/14/2014   No smoking   Alcohol: occasionally   Caffeine: yes    Occupation:part-time community service coordinator   Martial Status : married   Children 2 sons,1 daughter  Social Determinants of Health   Financial Resource Strain: Not on file  Food Insecurity: Not on file  Transportation Needs: Not on file  Physical Activity: Not on file  Stress: Not on file  Social Connections: Not on file     Family History: The patient's family history includes Congenital heart disease in her brother; Diabetes in her sister; Healthy in her brother, brother, daughter, son, and son; Hypertension in her brother, father, sister, and sister; Hypertrophic cardiomyopathy in her brother; Kidney disease in her sister; Lung disease in her father; Melanoma in her sister; Other in her mother; Parkinson's disease in her sister; Squamous cell carcinoma in her sister.  ROS:   Please see the history of present illness.     All other systems reviewed and are negative.  EKGs/Labs/Other  Studies Reviewed:    The following studies were reviewed today:   EKG:   04/28/2023: Normal sinus rhythm, rate 80, low voltage, no ST abnormalities  Recent Labs: No results found for requested labs within last 365 days.  Recent Lipid Panel    Component Value Date/Time   CHOL 177 11/11/2019 1013   TRIG 197 (H) 11/11/2019 1013   HDL 75 11/11/2019 1013   CHOLHDL 2.4 11/11/2019 1013   LDLCALC 70 11/11/2019 1013    Physical Exam:    VS:  BP 122/74   Pulse 80   Ht 5' 6.5" (1.689 m)   Wt 140 lb (63.5 kg)   SpO2 94%   BMI 22.26 kg/m     Wt Readings from Last 3 Encounters:  04/28/23 140 lb (63.5 kg)  04/29/22 133 lb (60.3 kg)  04/11/21 132 lb 12.8 oz (60.2 kg)     GEN:  Well nourished, well developed in no acute distress HEENT: Normal NECK: No JVD; No carotid bruits LYMPHATICS: No lymphadenopathy CARDIAC: RRR, no murmurs, rubs, gallops RESPIRATORY:  Clear to auscultation without rales, wheezing or rhonchi  ABDOMEN: Soft, non-tender, non-distended MUSCULOSKELETAL:  No edema; No deformity  SKIN: Warm and dry NEUROLOGIC:  Alert and oriented x 3 PSYCHIATRIC:  Normal affect   ASSESSMENT:    1. Precordial pain   2. DOE (dyspnea on exertion)   3. Mitral valve insufficiency, unspecified etiology   4. Premature atrial contractions   5. SVT (supraventricular tachycardia)   6. Hyperlipidemia, unspecified hyperlipidemia type    PLAN:    CAD: Calcium score 06/2018 was 23 (66 percentile).  ETT 02/2018 showed good exercise capacity (10.1 METS), no evidence of ischemia.   -Reports recent atypical chest pain.  Also having dyspnea on exertion that could represent anginal equivalent.  Recommend coronary CTA for further evaluation.  Will hold home Toprol-XL and give 100 mg Lopressor prior to study  PACs/SVT: Holter monitor 02/2018 showed no sustained arrhythmias, nonsustained SVT up to 16 beats.  Calcium score 06/2018 was 23 (66 percentile).  30-day monitor 10/2019 showed 10% PACs. -On  Toprol-XL 25 mg daily -Reports palpitations have improved, particular since starting CPAP.  Will check Zio patch x 3 days to quantify PAC burden  Mitral regurgitation: Reported history but no recent echo.  Check echo  OSA: On CPAP, reports compliance  Hyperlipidemia: On rosuvastatin 10 mg daily. LDL 65 on 04/15/23   RTC in 3 months  Medication Adjustments/Labs and Tests Ordered: Current medicines are reviewed at length with the patient today.  Concerns regarding medicines are outlined above.  Orders Placed This Encounter  Procedures   CT CORONARY MORPH W/CTA COR W/SCORE W/CA W/CM &/OR WO/CM  LONG TERM MONITOR (3-14 DAYS)   EKG 12-Lead   ECHOCARDIOGRAM COMPLETE   Meds ordered this encounter  Medications   metoprolol tartrate (LOPRESSOR) 100 MG tablet    Sig: Take 1 tablet (100mg ) TWO hours prior to CT scan    Dispense:  1 tablet    Refill:  0    Patient Instructions  Medication Instructions:  Your physician recommends that you continue on your current medications as directed. Please refer to the Current Medication list given to you today.  *If you need a refill on your cardiac medications before your next appointment, please call your pharmacy*  Testing/Procedures: Coronary CTA-see instructions below  Your physician has requested that you have an echocardiogram. Echocardiography is a painless test that uses sound waves to create images of your heart. It provides your doctor with information about the size and shape of your heart and how well your heart's chambers and valves are working. This procedure takes approximately one hour. There are no restrictions for this procedure. Please do NOT wear cologne, perfume, aftershave, or lotions (deodorant is allowed). Please arrive 15 minutes prior to your appointment time.   ZIO XT- Long Term Monitor Instructions   Your physician has requested you wear a ZIO patch monitor for _3_ days.  This is a single patch monitor.   [employer]  supplies one patch monitor per enrollment. Additional stickers are not available. Please do not apply patch if you will be having a Nuclear Stress Test, Echocardiogram, Cardiac CT, MRI, or Chest Xray during the period you would be wearing the monitor. The patch cannot be worn during these tests. You cannot remove and re-apply the ZIO XT patch monitor.  Your ZIO patch monitor will be sent Fed Ex from [employer] directly to your home address. It may take 3-5 days to receive your monitor after you have been enrolled.  Once you have received your monitor, please review the enclosed instructions. Your monitor has already been registered assigning a specific monitor serial # to you.  Billing and Patient Assistance Program Information   We have supplied [employer] with any of your insurance information on file for billing purposes. [employer] offers a sliding scale Patient Assistance Program for patients that do not have insurance, or whose insurance does not completely cover the cost of the ZIO monitor.   You must apply for the Patient Assistance Program to qualify for this discounted rate.     To apply, please call [employer] at [phone], select option 4, then select option 2, and ask to apply for Patient Assistance Program.  Meredeth Ide will ask your household income, and how many people are in your household.  They will quote your out-of-pocket cost based on that information.  [employer] will also be able to set up a 65-month, interest-free payment plan if needed.  Applying the monitor   Shave hair from upper left chest.  Hold abrader disc by orange tab. Rub abrader in 40 strokes over the upper left chest as indicated in your monitor instructions.  Clean area with 4 enclosed alcohol pads. Let dry.  Apply patch as indicated in monitor instructions. Patch will be placed under collarbone on left side of chest with arrow pointing upward.  Rub patch adhesive wings for 2 minutes. Remove white label marked "1".  Remove the white label marked "2". Rub patch adhesive wings for 2 additional minutes.  While looking in a mirror, press and release button in center of patch. A small green light will flash 3-4 times.  This will be your only indicator that the monitor has been turned on. ?  Do not shower for the first 24 hours. You may shower after the first 24 hours.  Press the button if you feel a symptom. You will hear a small click. Record Date, Time and Symptom in the Patient Logbook.  When you are ready to remove the patch, follow instructions on the last 2 pages of the Patient Logbook. Stick patch monitor onto the last page of Patient Logbook.  Place Patient Logbook in the blue and white box.  Use locking tab on box and tape box closed securely.  The blue and white box has prepaid postage on it. Please place it in the mailbox as soon as possible. Your physician should have your test results approximately 7 days after the monitor has been mailed back to [facility].  Call [facility] Customer Care at [phone] if you have questions regarding your ZIO XT patch monitor. Call them immediately if you see an orange light blinking on your monitor.  If your monitor falls off in less than 4 days, contact our Monitor department at [phone]. ?If your monitor becomes loose or falls off after 4 days call [employer] at [phone] for suggestions on securing your monitor.?    Follow-Up: At [facility], you and your health needs are our priority.  As part of our continuing mission to provide you with exceptional heart care, we have created designated Provider Care Teams.  These Care Teams include your primary Cardiologist (physician) and Advanced Practice Providers (APPs -  Physician Assistants and Nurse Practitioners) who all work together to provide you with the care you need, when you need it.  We recommend signing up for the patient portal called "MyChart".  Sign up information is provided on this  After Visit Summary.  MyChart is used to connect with patients for Virtual Visits (Telemedicine).  Patients are able to view lab/test results, encounter notes, upcoming appointments, etc.  Non-urgent messages can be sent to your provider as well.   To learn more about what you can do with MyChart, go to [email or web address].    Your next appointment:   3 month(s)  Provider:   Little Ishikawa, MD     Other Instructions   Your cardiac CT will be scheduled at one of the below locations:   [facility] [address] [place], [place] [address] [phone]  If scheduled at [facility], please arrive at the [facility] and Children's Entrance (Entrance C2) of [facility] 30 minutes prior to test start time. You can use the FREE valet parking offered at entrance C (encouraged to control the heart rate for the test)  Proceed to the [facility] Radiology Department (first floor) to check-in and test prep.  All radiology patients and guests should use entrance C2 at [facility], accessed from [facility], even though the hospital's physical address listed is [address]    Please follow these instructions carefully (unless otherwise directed):  On the Night Before the Test: Be sure to Drink plenty of water. Do not consume any caffeinated/decaffeinated beverages or chocolate 12 hours prior to your test. Do not take any antihistamines 12 hours prior to your test.  On the Day of the Test: Drink plenty of water until 1 hour prior to the test. Do not eat any food 1 hour prior to test. You may take your regular medications prior to the test.  HOLD TOPROL  XL day of test Take metoprolol 100 mg (Lopressor) two hours prior to test. If you take Furosemide/Hydrochlorothiazide/Spironolactone, please HOLD on the morning of the test. FEMALES- please wear underwire-free bra if available, avoid dresses & tight clothing      After the  Test: Drink plenty of water. After receiving IV contrast, you may experience a mild flushed feeling. This is normal. On occasion, you may experience a mild rash up to 24 hours after the test. This is not dangerous. If this occurs, you can take Benadryl 25 mg and increase your fluid intake. If you experience trouble breathing, this can be serious. If it is severe call 911 IMMEDIATELY. If it is mild, please call our office. If you take any of these medications: Glipizide/Metformin, Avandament, Glucavance, please do not take 48 hours after completing test unless otherwise instructed.  We will call to schedule your test 2-4 weeks out understanding that some insurance companies will need an authorization prior to the service being performed.   For non-scheduling related questions, please contact the cardiac imaging nurse navigator should you have any questions/concerns: Rockwell Alexandria, Cardiac Imaging Nurse Navigator Larey Brick, Cardiac Imaging Nurse Navigator [facility] Direct Office Dial: [phone]   For scheduling needs, including cancellations and rescheduling, please call [place], [phone].     Signed, Little Ishikawa, MD  04/28/2023 9:22 AM    [facility]

## 2023-04-28 ENCOUNTER — Ambulatory Visit (INDEPENDENT_AMBULATORY_CARE_PROVIDER_SITE_OTHER): Payer: Medicare Other

## 2023-04-28 ENCOUNTER — Encounter: Payer: Self-pay | Admitting: Cardiology

## 2023-04-28 ENCOUNTER — Ambulatory Visit: Payer: Medicare Other | Attending: Cardiology | Admitting: Cardiology

## 2023-04-28 VITALS — BP 122/74 | HR 80 | Ht 66.5 in | Wt 140.0 lb

## 2023-04-28 DIAGNOSIS — I471 Supraventricular tachycardia, unspecified: Secondary | ICD-10-CM | POA: Diagnosis present

## 2023-04-28 DIAGNOSIS — I491 Atrial premature depolarization: Secondary | ICD-10-CM

## 2023-04-28 DIAGNOSIS — R0609 Other forms of dyspnea: Secondary | ICD-10-CM | POA: Diagnosis not present

## 2023-04-28 DIAGNOSIS — R072 Precordial pain: Secondary | ICD-10-CM | POA: Diagnosis not present

## 2023-04-28 DIAGNOSIS — I340 Nonrheumatic mitral (valve) insufficiency: Secondary | ICD-10-CM | POA: Insufficient documentation

## 2023-04-28 DIAGNOSIS — E785 Hyperlipidemia, unspecified: Secondary | ICD-10-CM | POA: Insufficient documentation

## 2023-04-28 MED ORDER — METOPROLOL TARTRATE 100 MG PO TABS: ORAL_TABLET | ORAL | 0 refills | Status: AC

## 2023-04-28 NOTE — Patient Instructions (Signed)
Medication Instructions:  Your physician recommends that you continue on your current medications as directed. Please refer to the Current Medication list given to you today.  *If you need a refill on your cardiac medications before your next appointment, please call your pharmacy*  Testing/Procedures: Coronary CTA-see instructions below  Your physician has requested that you have an echocardiogram. Echocardiography is a painless test that uses sound waves to create images of your heart. It provides your doctor with information about the size and shape of your heart and how well your heart's chambers and valves are working. This procedure takes approximately one hour. There are no restrictions for this procedure. Please do NOT wear cologne, perfume, aftershave, or lotions (deodorant is allowed). Please arrive 15 minutes prior to your appointment time.   ZIO XT- Long Term Monitor Instructions   Your physician has requested you wear a ZIO patch monitor for _3_ days.  This is a single patch monitor.   [employer] supplies one patch monitor per enrollment. Additional stickers are not available. Please do not apply patch if you will be having a Nuclear Stress Test, Echocardiogram, Cardiac CT, MRI, or Chest Xray during the period you would be wearing the monitor. The patch cannot be worn during these tests. You cannot remove and re-apply the ZIO XT patch monitor.  Your ZIO patch monitor will be sent Fed Ex from [employer] directly to your home address. It may take 3-5 days to receive your monitor after you have been enrolled.  Once you have received your monitor, please review the enclosed instructions. Your monitor has already been registered assigning a specific monitor serial # to you.  Billing and Patient Assistance Program Information   We have supplied [employer] with any of your insurance information on file for billing purposes. [employer] offers a sliding scale Patient Assistance Program for  patients that do not have insurance, or whose insurance does not completely cover the cost of the ZIO monitor.   You must apply for the Patient Assistance Program to qualify for this discounted rate.     To apply, please call [employer] at [phone], select option 4, then select option 2, and ask to apply for Patient Assistance Program.  Meredeth Ide will ask your household income, and how many people are in your household.  They will quote your out-of-pocket cost based on that information.  [employer] will also be able to set up a 70-month, interest-free payment plan if needed.  Applying the monitor   Shave hair from upper left chest.  Hold abrader disc by orange tab. Rub abrader in 40 strokes over the upper left chest as indicated in your monitor instructions.  Clean area with 4 enclosed alcohol pads. Let dry.  Apply patch as indicated in monitor instructions. Patch will be placed under collarbone on left side of chest with arrow pointing upward.  Rub patch adhesive wings for 2 minutes. Remove white label marked "1". Remove the white label marked "2". Rub patch adhesive wings for 2 additional minutes.  While looking in a mirror, press and release button in center of patch. A small green light will flash 3-4 times. This will be your only indicator that the monitor has been turned on. ?  Do not shower for the first 24 hours. You may shower after the first 24 hours.  Press the button if you feel a symptom. You will hear a small click. Record Date, Time and Symptom in the Patient Logbook.  When you are ready to remove the  patch, follow instructions on the last 2 pages of the Patient Logbook. Stick patch monitor onto the last page of Patient Logbook.  Place Patient Logbook in the blue and white box.  Use locking tab on box and tape box closed securely.  The blue and white box has prepaid postage on it. Please place it in the mailbox as soon as possible. Your physician should have your test results approximately 7  days after the monitor has been mailed back to [facility].  Call [facility] Customer Care at [phone] if you have questions regarding your ZIO XT patch monitor. Call them immediately if you see an orange light blinking on your monitor.  If your monitor falls off in less than 4 days, contact our Monitor department at [phone]. ?If your monitor becomes loose or falls off after 4 days call [employer] at [phone] for suggestions on securing your monitor.?    Follow-Up: At [facility], you and your health needs are our priority.  As part of our continuing mission to provide you with exceptional heart care, we have created designated Provider Care Teams.  These Care Teams include your primary Cardiologist (physician) and Advanced Practice Providers (APPs -  Physician Assistants and Nurse Practitioners) who all work together to provide you with the care you need, when you need it.  We recommend signing up for the patient portal called "MyChart".  Sign up information is provided on this After Visit Summary.  MyChart is used to connect with patients for Virtual Visits (Telemedicine).  Patients are able to view lab/test results, encounter notes, upcoming appointments, etc.  Non-urgent messages can be sent to your provider as well.   To learn more about what you can do with MyChart, go to [email or web address].    Your next appointment:   3 month(s)  Provider:   Little Ishikawa, MD     Other Instructions   Your cardiac CT will be scheduled at one of the below locations:   [facility] [address] [place], [place] [address] [phone]  If scheduled at [facility], please arrive at the [facility] and Children's Entrance (Entrance C2) of [facility] 30 minutes prior to test start time. You can use the FREE valet parking offered at entrance C (encouraged to control the heart rate for the test)  Proceed to the [facility] Radiology  Department (first floor) to check-in and test prep.  All radiology patients and guests should use entrance C2 at [facility], accessed from [facility], even though the hospital's physical address listed is [address].    Please follow these instructions carefully (unless otherwise directed):  On the Night Before the Test: Be sure to Drink plenty of water. Do not consume any caffeinated/decaffeinated beverages or chocolate 12 hours prior to your test. Do not take any antihistamines 12 hours prior to your test.  On the Day of the Test: Drink plenty of water until 1 hour prior to the test. Do not eat any food 1 hour prior to test. You may take your regular medications prior to the test.  HOLD TOPROL XL day of test Take metoprolol 100 mg (Lopressor) two hours prior to test. If you take Furosemide/Hydrochlorothiazide/Spironolactone, please HOLD on the morning of the test. FEMALES- please wear underwire-free bra if available, avoid dresses & tight clothing      After the Test: Drink plenty of water. After receiving IV contrast, you may experience a mild flushed feeling. This  is normal. On occasion, you may experience a mild rash up to 24 hours after the test. This is not dangerous. If this occurs, you can take Benadryl 25 mg and increase your fluid intake. If you experience trouble breathing, this can be serious. If it is severe call 911 IMMEDIATELY. If it is mild, please call our office. If you take any of these medications: Glipizide/Metformin, Avandament, Glucavance, please do not take 48 hours after completing test unless otherwise instructed.  We will call to schedule your test 2-4 weeks out understanding that some insurance companies will need an authorization prior to the service being performed.   For non-scheduling related questions, please contact the cardiac imaging nurse navigator should you have any questions/concerns: Rockwell Alexandria, Cardiac  Imaging Nurse Navigator Larey Brick, Cardiac Imaging Nurse Navigator [facility] Direct Office Dial: [phone]   For scheduling needs, including cancellations and rescheduling, please call [place], [phone].

## 2023-04-28 NOTE — Progress Notes (Unsigned)
Enrolled for [employer] to mail a ZIO XT long term holter monitor to the patients address on file.  

## 2023-04-30 DIAGNOSIS — I471 Supraventricular tachycardia, unspecified: Secondary | ICD-10-CM | POA: Diagnosis not present

## 2023-04-30 DIAGNOSIS — I491 Atrial premature depolarization: Secondary | ICD-10-CM | POA: Diagnosis not present

## 2023-05-02 ENCOUNTER — Telehealth (HOSPITAL_COMMUNITY): Payer: Self-pay | Admitting: *Deleted

## 2023-05-02 NOTE — Telephone Encounter (Signed)
Attempted to call patient regarding upcoming cardiac CT appointment. °Left message on voicemail with name and callback number ° °Sharron Simpson RN Navigator Cardiac Imaging °[facility] °[phone] Office °[phone] Cell ° °

## 2023-05-05 ENCOUNTER — Ambulatory Visit (HOSPITAL_COMMUNITY)
Admission: RE | Admit: 2023-05-05 | Discharge: 2023-05-05 | Disposition: A | Discharge status: Home / Self Care | Payer: Medicare Other | Source: Ambulatory Visit | Attending: Cardiology | Admitting: Cardiology

## 2023-05-05 DIAGNOSIS — R0609 Other forms of dyspnea: Secondary | ICD-10-CM | POA: Diagnosis not present

## 2023-05-05 DIAGNOSIS — R072 Precordial pain: Secondary | ICD-10-CM | POA: Diagnosis not present

## 2023-05-05 MED ORDER — NITROGLYCERIN 0.4 MG SL SUBL
SUBLINGUAL_TABLET | SUBLINGUAL | Status: AC
  Filled 2023-05-05: qty 2

## 2023-05-05 MED ORDER — NITROGLYCERIN 0.4 MG SL SUBL
0.8000 mg | SUBLINGUAL_TABLET | Freq: Once | SUBLINGUAL | Status: AC
  Administered 2023-05-05: 0.8 mg via SUBLINGUAL

## 2023-05-05 MED ORDER — IOHEXOL 350 MG/ML SOLN
95.0000 mL | Freq: Once | INTRAVENOUS | Status: AC | PRN
  Administered 2023-05-05: 95 mL via INTRAVENOUS

## 2023-05-08 ENCOUNTER — Other Ambulatory Visit: Payer: Self-pay

## 2023-05-08 MED ORDER — METOPROLOL SUCCINATE ER 25 MG PO TB24: 25.0000 mg | ORAL_TABLET | Freq: Every day | ORAL | 3 refills | Status: DC

## 2023-05-09 ENCOUNTER — Telehealth: Payer: Self-pay | Admitting: Cardiology

## 2023-05-09 DIAGNOSIS — R918 Other nonspecific abnormal finding of lung field: Secondary | ICD-10-CM

## 2023-05-09 NOTE — Telephone Encounter (Signed)
Patient is returning call. Requesting return call.  

## 2023-05-09 NOTE — Telephone Encounter (Signed)
Pt aware Cardiac CT results ./cy Per pt persistent cough no other symptoms Pt wi l call PCP and f/u on possible pneumonia ./cy     CT lungs shows possible pneumonia vs scarring in right lower lobe of the lungs.  Would inquire about any symptoms of pneumonia (fever, cough, dyspnea) and recommend f/u with PCP for evaluation.  Recommend repeat CT chest in 3 months     Plaque in heart arteries but no blockages seen.  Continue rosuvastatin

## 2023-05-13 NOTE — Addendum Note (Signed)
Addended by: Johney Frame A on: 05/13/2023 01:54 PM   Modules accepted: Orders

## 2023-05-14 ENCOUNTER — Other Ambulatory Visit: Payer: Self-pay | Admitting: *Deleted

## 2023-05-14 MED ORDER — ROSUVASTATIN CALCIUM 10 MG PO TABS: 10.0000 mg | ORAL_TABLET | Freq: Every day | ORAL | 3 refills | Status: DC

## 2023-05-23 ENCOUNTER — Ambulatory Visit (HOSPITAL_COMMUNITY): Payer: Medicare Other | Attending: Cardiology

## 2023-05-23 DIAGNOSIS — I340 Nonrheumatic mitral (valve) insufficiency: Secondary | ICD-10-CM

## 2023-05-23 LAB — ECHOCARDIOGRAM COMPLETE
Area-P 1/2: 2.73 cm2
Calc EF: 63.6 %
S' Lateral: 2.6 cm
Single Plane A2C EF: 60.4 %
Single Plane A4C EF: 68.1 %

## 2023-07-28 NOTE — Progress Notes (Unsigned)
Cardiology Office Note:    Date:  07/31/2023   ID:  Mikayla Mccoy, DOB 1951-08-07, MRN [id number]  PCP:  Mikayla Hazel, MD  Cardiologist:  Mikayla Ishikawa, MD  Electrophysiologist:  None   Referring MD: Mikayla Hazel, MD   Chief Complaint  Patient presents with   Coronary Artery Disease    History of Present Illness:    Mikayla Mccoy is a 72 y.o. female with a hx of CAD, OSA, SVT who presents for follow-up.  She previously followed with Dr. Katrinka Mccoy.  Initial clinic visit with me was on 04/28/2023  ETT 02/2018 showed good exercise capacity (10.1 METS), no evidence of ischemia.  Holter monitor 02/2018 showed no sustained arrhythmias, nonsustained SVT up to 16 beats.  Calcium score 06/2018 was 23 (66 percentile).  30-day monitor 10/2019 showed 10% PACs.  Coronary CTA on 05/05/2023 showed nonobstructive CAD, calcium score 75 (68th percentile); also noted to have right lower lobe airspace opacity, follow-up CT chest recommended in 3 to 39-months.  Echo 05/23/2023 showed normal biventricular function, grade 1 diastolic dysfunction, no significant valvular disease.  Zio patch x 3 days 04/2023 showed 1 episode of NSVT lasting 6 beats, occasional PACs (4.3%) and supraventricular couplets (2.1%).  Since last clinic visit, she reports she is doing well.  Denies any recent chest pain.  Was having cough but resolved with antibiotics.  Reports 1 episode of palpitations where she woke up during the night, just lasted for few seconds.  She denies any lightheadedness or syncope.  Does report some lower extremity edema, happened after driving to [place], reports has resolved.  She is doing exercise classes, does barre and strength training and goes for walks, does about 30 minutes/day of exercise.  Past Medical History:  Diagnosis Date   Mitral regurgitation    mildly thickened MV by echo 7/11   Neck pain    Osteopenia    Right hand pain    Shoulder pain, left     No past surgical history on  file.  Current Medications: Current Meds  Medication Sig   albuterol (VENTOLIN HFA) 108 (90 Base) MCG/ACT inhaler Inhale 1 puff into the lungs as needed.   alendronate (FOSAMAX) 70 MG tablet Take 70 mg by mouth once a week.   aspirin EC 81 MG tablet Take 1 tablet (81 mg total) by mouth daily.   Cholecalciferol (VITAMIN D3) 25 MCG (1000 UT) CAPS Take 1,000 Units by mouth daily.   Estradiol 0.25 MG/0.25GM GEL Place onto the skin.   Fexofenadine HCl (ALLEGRA ALLERGY PO) Take 10 mg by mouth daily.   FIBER PO Take 1 tablet by mouth daily.   ibuprofen (ADVIL,MOTRIN) 200 MG tablet Take 400 mg by mouth 3 (three) times daily as needed for headache or moderate pain.   metoprolol succinate (TOPROL-XL) 25 MG 24 hr tablet Take 1 tablet (25 mg total) by mouth daily.   Multiple Vitamins-Minerals (MULTIVITAMIN PO) Take 1 tablet by mouth daily.   Probiotic Product (ALIGN PO) Take 1 capsule by mouth daily.   rosuvastatin (CRESTOR) 10 MG tablet Take 1 tablet (10 mg total) by mouth daily.   terbinafine (LAMISIL) 250 MG tablet Take 250 mg by mouth daily.     Allergies:   Codeine   Social History   Socioeconomic History   Marital status: Married    Spouse name: Not on file   Number of children: Not on file   Years of education: Not on file   Highest education level: Not on file  Occupational History   Not on file  Tobacco Use   Smoking status: Never   Smokeless tobacco: Never  Vaping Use   Vaping status: Never Used  Substance and Sexual Activity   Alcohol use: Yes    Comment: 1-2 per week   Drug use: No   Sexual activity: Not on file  Other Topics Concern   Not on file  Social History Narrative   Tobacco use cigarettes: Never smoked   Tobacco history last updated 04/14/2014   No smoking   Alcohol: occasionally   Caffeine: yes    Occupation:part-time Research officer, political party Status : married   Children 2 sons,1 daughter               Social Determinants of Health    Financial Resource Strain: Not on file  Food Insecurity: Not on file  Transportation Needs: Not on file  Physical Activity: Not on file  Stress: Not on file  Social Connections: Not on file     Family History: The patient's family history includes Congenital heart disease in her brother; Diabetes in her sister; Healthy in her brother, brother, daughter, son, and son; Hypertension in her brother, father, sister, and sister; Hypertrophic cardiomyopathy in her brother; Kidney disease in her sister; Lung disease in her father; Melanoma in her sister; Other in her mother; Parkinson's disease in her sister; Squamous cell carcinoma in her sister.  ROS:   Please see the history of present illness.     All other systems reviewed and are negative.  EKGs/Labs/Other Studies Reviewed:    The following studies were reviewed today:   EKG:   04/28/2023: Normal sinus rhythm, rate 80, low voltage, no ST abnormalities 07/31/23: Sinus bradycardia, PACs, low voltage, rate 51, Qtc 376  Recent Labs: No results found for requested labs within last 365 days.  Recent Lipid Panel    Component Value Date/Time   CHOL 177 11/11/2019 1013   TRIG 197 (H) 11/11/2019 1013   HDL 75 11/11/2019 1013   CHOLHDL 2.4 11/11/2019 1013   LDLCALC 70 11/11/2019 1013    Physical Exam:    VS:  BP 118/76 (BP Location: Left Arm, Patient Position: Sitting, Cuff Size: Normal)   Pulse (!) 51   Ht 5\' 7"  (1.702 m)   Wt 143 lb 9.6 oz (65.1 kg)   SpO2 98%   BMI 22.49 kg/m     Wt Readings from Last 3 Encounters:  07/31/23 143 lb 9.6 oz (65.1 kg)  04/28/23 140 lb (63.5 kg)  04/29/22 133 lb (60.3 kg)     GEN:  Well nourished, well developed in no acute distress HEENT: Normal NECK: No JVD; No carotid bruits LYMPHATICS: No lymphadenopathy CARDIAC: RRR, no murmurs, rubs, gallops RESPIRATORY:  Clear to auscultation without rales, wheezing or rhonchi  ABDOMEN: Soft, non-tender, non-distended MUSCULOSKELETAL:  No edema;  No deformity  SKIN: Warm and dry NEUROLOGIC:  Alert and oriented x 3 PSYCHIATRIC:  Normal affect   ASSESSMENT:    1. Coronary artery disease involving native coronary artery of native heart without angina pectoris   2. SVT (supraventricular tachycardia)   3. Hyperlipidemia, unspecified hyperlipidemia type   4. Opacity of lung on imaging study     PLAN:    CAD: Calcium score 06/2018 was 23 (66 percentile).  ETT 02/2018 showed good exercise capacity (10.1 METS), no evidence of ischemia.  Reported atypical chest pain and underwent coronary CTA 05/05/2023, which showed nonobstructive CAD, calcium score 75 (68th percentile);  also noted to have right lower lobe airspace opacity, follow-up CT chest recommended in 3 to 15-months.  Echo 05/23/2023 showed normal biventricular function, grade 1 diastolic dysfunction, no significant valvular disease.  -Continue rosuvastatin 10 mg daily  PACs/SVT: Holter monitor 02/2018 showed no sustained arrhythmias, nonsustained SVT up to 16 beats.  Calcium score 06/2018 was 23 (66 percentile).  30-day monitor 10/2019 showed 10% PACs.  Zio patch x 3 days 04/2023 showed 1 episode of NSVT lasting 6 beats, occasional PACs (4.3%) and supraventricular couplets (2.1%). -On Toprol-XL 25 mg daily.  Average heart rate on Zio patch was 71 bpm, but heart rate 51 bpm in clinic today.  She was started on terbinafine for fungal infection recently, planning to be on for 22-month course.  Terbinafine can increase effects of metoprolol.  While on this medication, would recommend decreasing Toprol-XL dose to 12.5 mg daily.  Can resume her 25 mg dose once she completes her course of terbinafine -Reports palpitations have improved, particular since starting CPAP.    OSA: On CPAP, reports compliance  Hyperlipidemia: On rosuvastatin 10 mg daily. LDL 65 on 04/15/23   Right lower lobe airspace opacity: Noted on coronary CTA 04/2023, repeat CT chest recommended in 3 months, scheduled for later this  month  RTC in 6 months  Medication Adjustments/Labs and Tests Ordered: Current medicines are reviewed at length with the patient today.  Concerns regarding medicines are outlined above.  Orders Placed This Encounter  Procedures   EKG 12-Lead   No orders of the defined types were placed in this encounter.   Patient Instructions  Medication Instructions:  While taking Terbinafine decrease Metoprolol to 12.5 mg daily.When you stop Terbinafine then increase Metoprolol back to 25 mg daily Continue all other medications *If you need a refill on your cardiac medications before your next appointment, please call your pharmacy*   Lab Work: None ordered   Testing/Procedures: None ordered   Follow-Up: At [facility], you and your health needs are our priority.  As part of our continuing mission to provide you with exceptional heart care, we have created designated Provider Care Teams.  These Care Teams include your primary Cardiologist (physician) and Advanced Practice Providers (APPs -  Physician Assistants and Nurse Practitioners) who all work together to provide you with the care you need, when you need it.  We recommend signing up for the patient portal called "MyChart".  Sign up information is provided on this After Visit Summary.  MyChart is used to connect with patients for Virtual Visits (Telemedicine).  Patients are able to view lab/test results, encounter notes, upcoming appointments, etc.  Non-urgent messages can be sent to your provider as well.   To learn more about what you can do with MyChart, go to [email or web address].    Your next appointment:  6 months    Call in Oct to schedule Feb appointment     Provider:  Dr.Winfred Redel     Signed, Mikayla Ishikawa, MD  07/31/2023 9:53 AM    [facility]

## 2023-07-31 ENCOUNTER — Encounter: Payer: Self-pay | Admitting: Cardiology

## 2023-07-31 ENCOUNTER — Ambulatory Visit: Payer: Medicare Other | Admitting: Cardiology

## 2023-07-31 VITALS — BP 118/76 | HR 51 | Ht 67.0 in | Wt 143.6 lb

## 2023-07-31 DIAGNOSIS — E785 Hyperlipidemia, unspecified: Secondary | ICD-10-CM | POA: Insufficient documentation

## 2023-07-31 DIAGNOSIS — I2510 Atherosclerotic heart disease of native coronary artery without angina pectoris: Secondary | ICD-10-CM | POA: Insufficient documentation

## 2023-07-31 DIAGNOSIS — R918 Other nonspecific abnormal finding of lung field: Secondary | ICD-10-CM | POA: Diagnosis present

## 2023-07-31 DIAGNOSIS — I471 Supraventricular tachycardia, unspecified: Secondary | ICD-10-CM | POA: Diagnosis not present

## 2023-07-31 NOTE — Patient Instructions (Signed)
Medication Instructions:  While taking Terbinafine decrease Metoprolol to 12.5 mg daily.When you stop Terbinafine then increase Metoprolol back to 25 mg daily Continue all other medications *If you need a refill on your cardiac medications before your next appointment, please call your pharmacy*   Lab Work: None ordered   Testing/Procedures: None ordered   Follow-Up: At [facility], you and your health needs are our priority.  As part of our continuing mission to provide you with exceptional heart care, we have created designated Provider Care Teams.  These Care Teams include your primary Cardiologist (physician) and Advanced Practice Providers (APPs -  Physician Assistants and Nurse Practitioners) who all work together to provide you with the care you need, when you need it.  We recommend signing up for the patient portal called "MyChart".  Sign up information is provided on this After Visit Summary.  MyChart is used to connect with patients for Virtual Visits (Telemedicine).  Patients are able to view lab/test results, encounter notes, upcoming appointments, etc.  Non-urgent messages can be sent to your provider as well.   To learn more about what you can do with MyChart, go to [email or web address].    Your next appointment:  6 months    Call in Oct to schedule Feb appointment     Provider:  Dr.Schumann

## 2023-08-13 ENCOUNTER — Ambulatory Visit
Admission: RE | Admit: 2023-08-13 | Discharge: 2023-08-13 | Disposition: A | Discharge status: Home / Self Care | Payer: Medicare Other | Source: Ambulatory Visit | Attending: Cardiology | Admitting: Cardiology

## 2023-08-13 DIAGNOSIS — R918 Other nonspecific abnormal finding of lung field: Secondary | ICD-10-CM

## 2023-08-21 ENCOUNTER — Telehealth: Payer: Self-pay | Admitting: Cardiology

## 2023-08-21 ENCOUNTER — Encounter: Payer: Self-pay | Admitting: Cardiology

## 2023-08-21 NOTE — Telephone Encounter (Signed)
Patient called to follow-up on test results. 

## 2023-08-21 NOTE — Telephone Encounter (Signed)
Patient called about CT results she had done last Wednesday. CT was done outside of [facility]. We have not received results. Advised to call diagnostic testing to have them send over results.

## 2023-08-22 NOTE — Telephone Encounter (Signed)
See result note.  

## 2023-08-22 NOTE — Telephone Encounter (Signed)
Spoke to patient chest ct results given.

## 2024-02-12 ENCOUNTER — Other Ambulatory Visit: Payer: Self-pay | Admitting: Cardiology

## 2024-05-12 ENCOUNTER — Other Ambulatory Visit: Payer: Self-pay | Admitting: Cardiology

## 2024-06-15 ENCOUNTER — Telehealth: Admitting: Physician Assistant

## 2024-06-15 DIAGNOSIS — H539 Unspecified visual disturbance: Secondary | ICD-10-CM

## 2024-06-15 NOTE — Progress Notes (Signed)
  Because of vision changes that are persistent and not resolving with wiping the eyes/blinking, I feel your condition warrants further evaluation and I recommend that you be seen in a face-to-face visit.   NOTE: There will be NO CHARGE for this E-Visit   If you are having a true medical emergency, please call 911.     For an urgent face to face visit, [place] has multiple urgent care centers for your convenience.  Click the link below for the full list of locations and hours, walk-in wait times, appointment scheduling options and driving directions:  Urgent Care - [place], [place], [place], [place], [place], [place]  [place]     Your MyChart E-visit questionnaire answers were reviewed by a board certified advanced clinical practitioner to complete your personal care plan based on your specific symptoms.    Thank you for using e-Visits.

## 2024-08-11 ENCOUNTER — Other Ambulatory Visit: Payer: Self-pay | Admitting: Cardiology

## 2024-08-19 NOTE — Progress Notes (Unsigned)
 Cardiology Office Note:    Date:  08/20/2024   ID:  Mikayla Mccoy, DOB [birth year], MRN [id number]  PCP:  Cleotilde Planas, MD  Cardiologist:  Lonni LITTIE Nanas, MD  Electrophysiologist:  None   Referring MD: Cleotilde Planas, MD   Chief Complaint  Patient presents with   Coronary Artery Disease    History of Present Illness:    Mikayla Mccoy is a 73 y.o. female with a hx of CAD, OSA, SVT who presents for follow-up.  She previously followed with Dr. Claudene.  Initial clinic visit with me was on 04/28/2023  ETT 02/2018 showed good exercise capacity (10.1 METS), no evidence of ischemia.  Holter monitor 02/2018 showed no sustained arrhythmias, nonsustained SVT up to 16 beats.  Calcium  score 06/2018 was 23 (66 percentile).  30-day monitor 10/2019 showed 10% PACs.  Coronary CTA on 05/05/2023 showed nonobstructive CAD, calcium  score 75 (68th percentile); also noted to have right lower lobe airspace opacity, follow-up CT chest recommended in 3 to 56-months.  Echo 05/23/2023 showed normal biventricular function, grade 1 diastolic dysfunction, no significant valvular disease.  Zio patch x 3 days 04/2023 showed 1 episode of NSVT lasting 6 beats, occasional PACs (4.3%) and supraventricular couplets (2.1%).  Since last clinic visit, she reports she is doing OK.  Having some abdominal pain recently. Denies any chest pain, dyspnea, lightheadedness, or syncope.  For exercise she will walk and do core exercises. She had 1 episode of palptiations, lasted few minutes, occurred about 1 month ago.  Also with some swelling in legs intermittently.   Past Medical History:  Diagnosis Date   Mitral regurgitation    mildly thickened MV by echo 7/11   Neck pain    Osteopenia    Right hand pain    Shoulder pain, left     History reviewed. No pertinent surgical history.  Current Medications: Current Meds  Medication Sig   albuterol (VENTOLIN HFA) 108 (90 Base) MCG/ACT inhaler Inhale 1 puff into the lungs as needed.    alendronate (FOSAMAX) 70 MG tablet Take 70 mg by mouth once a week.   aspirin  EC 81 MG tablet Take 1 tablet (81 mg total) by mouth daily.   Cholecalciferol (VITAMIN D3) 25 MCG (1000 UT) CAPS Take 1,000 Units by mouth daily.   Estradiol 0.25 MG/0.25GM GEL Place onto the skin.   Fexofenadine HCl (ALLEGRA ALLERGY PO) Take 10 mg by mouth daily.   FIBER PO Take 1 tablet by mouth daily.   ibuprofen (ADVIL,MOTRIN) 200 MG tablet Take 400 mg by mouth 3 (three) times daily as needed for headache or moderate pain.   metoprolol  succinate (TOPROL -XL) 25 MG 24 hr tablet TAKE 1 TABLET (25 MG TOTAL) BY MOUTH DAILY.   Multiple Vitamins-Minerals (MULTIVITAMIN PO) Take 1 tablet by mouth daily.   Probiotic Product (ALIGN PO) Take 1 capsule by mouth daily.   rosuvastatin  (CRESTOR ) 20 MG tablet Take 1 tablet (20 mg total) by mouth daily.   terbinafine (LAMISIL) 250 MG tablet Take 250 mg by mouth daily.   [DISCONTINUED] rosuvastatin  (CRESTOR ) 10 MG tablet TAKE 1 TABLET (10 MG TOTAL) BY MOUTH DAILY. PLEASE KEEP SCHEDULED APPOINTMENT FOR FUTURE REFILLS. THANK YOU.     Allergies:   Codeine   Social History   Socioeconomic History   Marital status: Married    Spouse name: Not on file   Number of children: Not on file   Years of education: Not on file   Highest education level: Not on file  Occupational  History   Not on file  Tobacco Use   Smoking status: Never   Smokeless tobacco: Never  Vaping Use   Vaping status: Never Used  Substance and Sexual Activity   Alcohol use: Yes    Comment: 1-2 per week   Drug use: No   Sexual activity: Not on file  Other Topics Concern   Not on file  Social History Narrative   Tobacco use cigarettes: Never smoked   Tobacco history last updated 04/14/2014   No smoking   Alcohol: occasionally   Caffeine: yes    Occupation:part-time Research officer, political party Status : married   Children 2 sons,1 daughter               Social Drivers of Manufacturing engineer Strain: Not on file  Food Insecurity: Not on file  Transportation Needs: Not on file  Physical Activity: Not on file  Stress: Not on file  Social Connections: Not on file     Family History: The patient's family history includes Congenital heart disease in her brother; Diabetes in her sister; Healthy in her brother, brother, daughter, son, and son; Hypertension in her brother, father, sister, and sister; Hypertrophic cardiomyopathy in her brother; Kidney disease in her sister; Lung disease in her father; Melanoma in her sister; Other in her mother; Parkinson's disease in her sister; Squamous cell carcinoma in her sister.  ROS:   Please see the history of present illness.     All other systems reviewed and are negative.  EKGs/Labs/Other Studies Reviewed:    The following studies were reviewed today:   EKG:   04/28/2023: Normal sinus rhythm, rate 80, low voltage, no ST abnormalities 07/31/23: Sinus bradycardia, PACs, low voltage, rate 51, Qtc 376 08/20/2024: Sinus rhythm, PACs, nonspecific T wave flattening, rate 62  Recent Labs: No results found for requested labs within last 365 days.  Recent Lipid Panel    Component Value Date/Time   CHOL 177 11/11/2019 1013   TRIG 197 (H) 11/11/2019 1013   HDL 75 11/11/2019 1013   CHOLHDL 2.4 11/11/2019 1013   LDLCALC 70 11/11/2019 1013    Physical Exam:    VS:  BP 130/76 (BP Location: Left Arm, Patient Position: Sitting, Cuff Size: Normal)   Pulse 72   Ht 5' 7 (1.702 m)   Wt 148 lb 3.2 oz (67.2 kg)   SpO2 98%   BMI 23.21 kg/m     Wt Readings from Last 3 Encounters:  08/20/24 148 lb 3.2 oz (67.2 kg)  07/31/23 143 lb 9.6 oz (65.1 kg)  04/28/23 140 lb (63.5 kg)     GEN:  Well nourished, well developed in no acute distress HEENT: Normal NECK: No JVD; No carotid bruits LYMPHATICS: No lymphadenopathy CARDIAC: RRR, no murmurs, rubs, gallops RESPIRATORY:  Clear to auscultation without rales, wheezing or rhonchi   ABDOMEN: Soft, non-tender, non-distended MUSCULOSKELETAL:  No edema; No deformity  SKIN: Warm and dry NEUROLOGIC:  Alert and oriented x 3 PSYCHIATRIC:  Normal affect   ASSESSMENT:    1. Coronary artery disease involving native coronary artery of native heart without angina pectoris   2. SVT (supraventricular tachycardia) (HCC)   3. Hyperlipidemia, unspecified hyperlipidemia type   4. Premature atrial contractions      PLAN:    CAD: Calcium  score 06/2018 was 23 (66 percentile).  ETT 02/2018 showed good exercise capacity (10.1 METS), no evidence of ischemia.  Reported atypical chest pain and underwent coronary CTA 05/05/2023,  which showed nonobstructive CAD, calcium  score 75 (68th percentile); also noted to have right lower lobe airspace opacity, follow-up CT chest recommended in 3 to 7-months.  Echo 05/23/2023 showed normal biventricular function, grade 1 diastolic dysfunction, no significant valvular disease.  -Continue rosuvastatin  20 mg daily  PACs/SVT: Holter monitor 02/2018 showed no sustained arrhythmias, nonsustained SVT up to 16 beats.  Calcium  score 06/2018 was 23 (66 percentile).  30-day monitor 10/2019 showed 10% PACs.  Zio patch x 3 days 04/2023 showed 1 episode of NSVT lasting 6 beats, occasional PACs (4.3%) and supraventricular couplets (2.1%). -Continue Toprol -XL 25 mg daily  -Reports palpitations have improved, particularly since starting CPAP    OSA: On CPAP, reports compliance  Hyperlipidemia: On rosuvastatin  20 mg daily. LDL 70 on 08/16/24  Right lower lobe airspace opacity: Noted on coronary CTA 04/2023, repeat CT chest 07/2023 showed resolution of airspace opacity.  RTC in 6 months  Medication Adjustments/Labs and Tests Ordered: Current medicines are reviewed at length with the patient today.  Concerns regarding medicines are outlined above.  Orders Placed This Encounter  Procedures   EKG 12-Lead   Meds ordered this encounter  Medications   rosuvastatin  (CRESTOR ) 20  MG tablet    Sig: Take 1 tablet (20 mg total) by mouth daily.    Dispense:  90 tablet    Refill:  3    Patient Instructions  Medication Instructions:  Increase Rosuvastatin  20 mg daily Continue current medications *If you need a refill on your cardiac medications before your next appointment, please call your pharmacy*  Lab Work: none If you have labs (blood work) drawn today and your tests are completely normal, you will receive your results only by: MyChart Message (if you have MyChart) OR A paper copy in the mail If you have any lab test that is abnormal or we need to change your treatment, we will call you to review the results.  Testing/Procedures: none  Follow-Up: At [facility], you and your health needs are our priority.  As part of our continuing mission to provide you with exceptional heart care, our providers are all part of one team.  This team includes your primary Cardiologist (physician) and Advanced Practice Providers or APPs (Physician Assistants and Nurse Practitioners) who all work together to provide you with the care you need, when you need it.  Your next appointment:   1 year  Provider:   Dr. Kate  We recommend signing up for the patient portal called MyChart.  Sign up information is provided on this After Visit Summary.  MyChart is used to connect with patients for Virtual Visits (Telemedicine).  Patients are able to view lab/test results, encounter notes, upcoming appointments, etc.  Non-urgent messages can be sent to your provider as well.   To learn more about what you can do with MyChart, go to [email or web address].   Other Instructions none       Signed, Lonni LITTIE Kate, MD  08/20/2024 11:16 AM    [facility]

## 2024-08-20 ENCOUNTER — Encounter: Payer: Self-pay | Admitting: Cardiology

## 2024-08-20 ENCOUNTER — Ambulatory Visit: Attending: Cardiology | Admitting: Cardiology

## 2024-08-20 VITALS — BP 130/76 | HR 72 | Ht 67.0 in | Wt 148.2 lb

## 2024-08-20 DIAGNOSIS — I471 Supraventricular tachycardia, unspecified: Secondary | ICD-10-CM | POA: Insufficient documentation

## 2024-08-20 DIAGNOSIS — I2510 Atherosclerotic heart disease of native coronary artery without angina pectoris: Secondary | ICD-10-CM | POA: Diagnosis present

## 2024-08-20 DIAGNOSIS — E785 Hyperlipidemia, unspecified: Secondary | ICD-10-CM | POA: Insufficient documentation

## 2024-08-20 DIAGNOSIS — I491 Atrial premature depolarization: Secondary | ICD-10-CM | POA: Diagnosis present

## 2024-08-20 MED ORDER — ROSUVASTATIN CALCIUM 20 MG PO TABS: 20.0000 mg | ORAL_TABLET | Freq: Every day | ORAL | 3 refills | Status: AC

## 2024-08-20 NOTE — Patient Instructions (Signed)
 Medication Instructions:  Increase Rosuvastatin  20 mg daily Continue current medications *If you need a refill on your cardiac medications before your next appointment, please call your pharmacy*  Lab Work: none If you have labs (blood work) drawn today and your tests are completely normal, you will receive your results only by: MyChart Message (if you have MyChart) OR A paper copy in the mail If you have any lab test that is abnormal or we need to change your treatment, we will call you to review the results.  Testing/Procedures: none  Follow-Up: At [facility], you and your health needs are our priority.  As part of our continuing mission to provide you with exceptional heart care, our providers are all part of one team.  This team includes your primary Cardiologist (physician) and Advanced Practice Providers or APPs (Physician Assistants and Nurse Practitioners) who all work together to provide you with the care you need, when you need it.  Your next appointment:   1 year  Provider:   Dr. Kate  We recommend signing up for the patient portal called MyChart.  Sign up information is provided on this After Visit Summary.  MyChart is used to connect with patients for Virtual Visits (Telemedicine).  Patients are able to view lab/test results, encounter notes, upcoming appointments, etc.  Non-urgent messages can be sent to your provider as well.   To learn more about what you can do with MyChart, go to [email or web address].   Other Instructions none

## 2024-09-11 ENCOUNTER — Other Ambulatory Visit: Payer: Self-pay | Admitting: Cardiology

## 2024-11-12 ENCOUNTER — Other Ambulatory Visit: Payer: Self-pay | Admitting: Cardiology

## 2024-11-15 MED ORDER — METOPROLOL SUCCINATE ER 25 MG PO TB24: 25.0000 mg | ORAL_TABLET | Freq: Every day | ORAL | 3 refills | Status: AC
